# Patient Record
Sex: Male | Born: 1990 | Race: Black or African American | Hispanic: No | Marital: Single | Smoking: Never smoker
Health system: Southern US, Community
[De-identification: ages and names within clinical notes are randomized; demographics above are authoritative.]

## PROBLEM LIST (undated history)

## (undated) DIAGNOSIS — F329 Major depressive disorder, single episode, unspecified: Secondary | ICD-10-CM

## (undated) DIAGNOSIS — E669 Obesity, unspecified: Secondary | ICD-10-CM

## (undated) DIAGNOSIS — I639 Cerebral infarction, unspecified: Secondary | ICD-10-CM

## (undated) DIAGNOSIS — I5021 Acute systolic (congestive) heart failure: Secondary | ICD-10-CM

## (undated) DIAGNOSIS — F32A Depression, unspecified: Secondary | ICD-10-CM

## (undated) HISTORY — PX: OTHER SURGICAL HISTORY: SHX169

---

## 2016-09-18 ENCOUNTER — Emergency Department (HOSPITAL_COMMUNITY): Payer: 59

## 2016-09-18 ENCOUNTER — Observation Stay (HOSPITAL_COMMUNITY)
Admission: EM | Admit: 2016-09-18 | Discharge: 2016-09-21 | Disposition: A | Discharge status: Home / Self Care | Payer: 59 | Attending: Family Medicine | Admitting: Family Medicine

## 2016-09-18 ENCOUNTER — Encounter (HOSPITAL_COMMUNITY): Payer: Self-pay | Admitting: Emergency Medicine

## 2016-09-18 DIAGNOSIS — R531 Weakness: Secondary | ICD-10-CM | POA: Diagnosis not present

## 2016-09-18 DIAGNOSIS — Z903 Acquired absence of stomach [part of]: Secondary | ICD-10-CM | POA: Insufficient documentation

## 2016-09-18 DIAGNOSIS — R931 Abnormal findings on diagnostic imaging of heart and coronary circulation: Secondary | ICD-10-CM

## 2016-09-18 DIAGNOSIS — F329 Major depressive disorder, single episode, unspecified: Secondary | ICD-10-CM | POA: Diagnosis not present

## 2016-09-18 DIAGNOSIS — I5042 Chronic combined systolic (congestive) and diastolic (congestive) heart failure: Secondary | ICD-10-CM | POA: Diagnosis not present

## 2016-09-18 DIAGNOSIS — I429 Cardiomyopathy, unspecified: Secondary | ICD-10-CM | POA: Diagnosis not present

## 2016-09-18 DIAGNOSIS — Z9884 Bariatric surgery status: Secondary | ICD-10-CM | POA: Insufficient documentation

## 2016-09-18 DIAGNOSIS — N289 Disorder of kidney and ureter, unspecified: Secondary | ICD-10-CM

## 2016-09-18 DIAGNOSIS — G9389 Other specified disorders of brain: Secondary | ICD-10-CM | POA: Insufficient documentation

## 2016-09-18 DIAGNOSIS — I428 Other cardiomyopathies: Secondary | ICD-10-CM

## 2016-09-18 DIAGNOSIS — I110 Hypertensive heart disease with heart failure: Secondary | ICD-10-CM | POA: Insufficient documentation

## 2016-09-18 DIAGNOSIS — R471 Dysarthria and anarthria: Secondary | ICD-10-CM | POA: Diagnosis not present

## 2016-09-18 DIAGNOSIS — I10 Essential (primary) hypertension: Secondary | ICD-10-CM | POA: Diagnosis not present

## 2016-09-18 DIAGNOSIS — R200 Anesthesia of skin: Secondary | ICD-10-CM | POA: Diagnosis not present

## 2016-09-18 DIAGNOSIS — R2990 Unspecified symptoms and signs involving the nervous system: Secondary | ICD-10-CM | POA: Diagnosis not present

## 2016-09-18 DIAGNOSIS — R001 Bradycardia, unspecified: Secondary | ICD-10-CM | POA: Diagnosis not present

## 2016-09-18 DIAGNOSIS — Z7982 Long term (current) use of aspirin: Secondary | ICD-10-CM | POA: Insufficient documentation

## 2016-09-18 DIAGNOSIS — R079 Chest pain, unspecified: Secondary | ICD-10-CM | POA: Insufficient documentation

## 2016-09-18 DIAGNOSIS — Z8673 Personal history of transient ischemic attack (TIA), and cerebral infarction without residual deficits: Secondary | ICD-10-CM | POA: Diagnosis not present

## 2016-09-18 DIAGNOSIS — I639 Cerebral infarction, unspecified: Secondary | ICD-10-CM

## 2016-09-18 HISTORY — DX: Cerebral infarction, unspecified: I63.9

## 2016-09-18 HISTORY — DX: Depression, unspecified: F32.A

## 2016-09-18 HISTORY — DX: Major depressive disorder, single episode, unspecified: F32.9

## 2016-09-18 HISTORY — DX: Acute systolic (congestive) heart failure: I50.21

## 2016-09-18 HISTORY — DX: Obesity, unspecified: E66.9

## 2016-09-18 LAB — URINALYSIS, ROUTINE W REFLEX MICROSCOPIC
BILIRUBIN URINE: NEGATIVE
GLUCOSE, UA: NEGATIVE mg/dL
HGB URINE DIPSTICK: NEGATIVE
KETONES UR: NEGATIVE mg/dL
Leukocytes, UA: NEGATIVE
NITRITE: NEGATIVE
PH: 6 (ref 5.0–8.0)
Protein, ur: 30 mg/dL — AB
Specific Gravity, Urine: 1.037 — ABNORMAL HIGH (ref 1.005–1.030)

## 2016-09-18 LAB — I-STAT CHEM 8, ED
BUN: 10 mg/dL (ref 6–20)
CHLORIDE: 103 mmol/L (ref 101–111)
Calcium, Ion: 1.12 mmol/L — ABNORMAL LOW (ref 1.15–1.40)
Creatinine, Ser: 1.3 mg/dL — ABNORMAL HIGH (ref 0.61–1.24)
Glucose, Bld: 81 mg/dL (ref 65–99)
HEMATOCRIT: 45 % (ref 39.0–52.0)
Hemoglobin: 15.3 g/dL (ref 13.0–17.0)
Potassium: 3.4 mmol/L — ABNORMAL LOW (ref 3.5–5.1)
SODIUM: 143 mmol/L (ref 135–145)
TCO2: 27 mmol/L (ref 0–100)

## 2016-09-18 LAB — PROTIME-INR
INR: 1.14
PROTHROMBIN TIME: 14.6 s (ref 11.4–15.2)

## 2016-09-18 LAB — DIFFERENTIAL
Basophils Absolute: 0 10*3/uL (ref 0.0–0.1)
Basophils Relative: 0 %
Eosinophils Absolute: 0.1 10*3/uL (ref 0.0–0.7)
Eosinophils Relative: 1 %
LYMPHS PCT: 49 %
Lymphs Abs: 3.5 10*3/uL (ref 0.7–4.0)
Monocytes Absolute: 0.7 10*3/uL (ref 0.1–1.0)
Monocytes Relative: 10 %
NEUTROS ABS: 2.9 10*3/uL (ref 1.7–7.7)
NEUTROS PCT: 40 %

## 2016-09-18 LAB — COMPREHENSIVE METABOLIC PANEL
ALBUMIN: 4.2 g/dL (ref 3.5–5.0)
ALK PHOS: 36 U/L — AB (ref 38–126)
ALT: 25 U/L (ref 17–63)
ANION GAP: 8 (ref 5–15)
AST: 23 U/L (ref 15–41)
BUN: 9 mg/dL (ref 6–20)
CALCIUM: 9.7 mg/dL (ref 8.9–10.3)
CO2: 26 mmol/L (ref 22–32)
CREATININE: 1.34 mg/dL — AB (ref 0.61–1.24)
Chloride: 107 mmol/L (ref 101–111)
GFR calc Af Amer: >60 mL/min (ref >60)
GFR calc non Af Amer: >60 mL/min (ref >60)
GLUCOSE: 85 mg/dL (ref 65–99)
Potassium: 3.5 mmol/L (ref 3.5–5.1)
SODIUM: 141 mmol/L (ref 135–145)
Total Bilirubin: 1 mg/dL (ref 0.3–1.2)
Total Protein: 7.2 g/dL (ref 6.5–8.1)

## 2016-09-18 LAB — URINE MICROSCOPIC-ADD ON

## 2016-09-18 LAB — I-STAT TROPONIN, ED: Troponin i, poc: 0.01 ng/mL (ref 0.00–0.08)

## 2016-09-18 LAB — RAPID URINE DRUG SCREEN, HOSP PERFORMED
AMPHETAMINES: NOT DETECTED
Barbiturates: NOT DETECTED
Benzodiazepines: NOT DETECTED
Cocaine: NOT DETECTED
OPIATES: NOT DETECTED
TETRAHYDROCANNABINOL: NOT DETECTED

## 2016-09-18 LAB — TROPONIN I: Troponin I: <0.03 ng/mL (ref <0.03)

## 2016-09-18 LAB — CBC
HCT: 42.1 % (ref 39.0–52.0)
Hemoglobin: 14.7 g/dL (ref 13.0–17.0)
MCH: 31.7 pg (ref 26.0–34.0)
MCHC: 34.9 g/dL (ref 30.0–36.0)
MCV: 90.9 fL (ref 78.0–100.0)
PLATELETS: 168 10*3/uL (ref 150–400)
RBC: 4.63 MIL/uL (ref 4.22–5.81)
RDW: 12.9 % (ref 11.5–15.5)
WBC: 7.2 10*3/uL (ref 4.0–10.5)

## 2016-09-18 LAB — CBG MONITORING, ED: Glucose-Capillary: 84 mg/dL (ref 65–99)

## 2016-09-18 LAB — ANTITHROMBIN III: ANTITHROMB III FUNC: 118 % (ref 75–120)

## 2016-09-18 LAB — APTT: aPTT: 27 seconds (ref 24–36)

## 2016-09-18 LAB — ETHANOL: Alcohol, Ethyl (B): <5 mg/dL (ref <5)

## 2016-09-18 MED ORDER — ASPIRIN 300 MG RE SUPP: 300.0000 mg | Freq: Every day | RECTAL | Status: DC

## 2016-09-18 MED ORDER — HEPARIN SODIUM (PORCINE) 5000 UNIT/ML IJ SOLN: 5000.0000 [IU] | Freq: Three times a day (TID) | INTRAMUSCULAR | Status: DC

## 2016-09-18 MED ORDER — ASPIRIN 325 MG PO TABS
325.0000 mg | ORAL_TABLET | Freq: Every day | ORAL | Status: DC
  Filled 2016-09-18: qty 1

## 2016-09-18 MED ORDER — SODIUM CHLORIDE 0.9% FLUSH
3.0000 mL | Freq: Two times a day (BID) | INTRAVENOUS | Status: DC
  Administered 2016-09-19 – 2016-09-21 (×4): 3 mL via INTRAVENOUS

## 2016-09-18 MED ORDER — SODIUM CHLORIDE 0.9 % IV SOLN: 250.0000 mL | INTRAVENOUS | Status: DC | PRN

## 2016-09-18 MED ORDER — SODIUM CHLORIDE 0.9 % IV SOLN
INTRAVENOUS | Status: DC
  Administered 2016-09-18 – 2016-09-20 (×6): via INTRAVENOUS

## 2016-09-18 MED ORDER — IOPAMIDOL (ISOVUE-370) INJECTION 76%
INTRAVENOUS | Status: AC
  Filled 2016-09-18: qty 50

## 2016-09-18 MED ORDER — ASPIRIN 325 MG PO TABS
325.0000 mg | ORAL_TABLET | Freq: Every day | ORAL | Status: DC
  Administered 2016-09-19 – 2016-09-21 (×3): 325 mg via ORAL
  Filled 2016-09-18 (×3): qty 1

## 2016-09-18 MED ORDER — CLOPIDOGREL BISULFATE 75 MG PO TABS
75.0000 mg | ORAL_TABLET | Freq: Every day | ORAL | Status: DC
  Administered 2016-09-18 – 2016-09-21 (×4): 75 mg via ORAL
  Filled 2016-09-18 (×4): qty 1

## 2016-09-18 MED ORDER — FAMOTIDINE 20 MG PO TABS
40.0000 mg | ORAL_TABLET | Freq: Every day | ORAL | Status: DC
  Administered 2016-09-18 – 2016-09-21 (×4): 40 mg via ORAL
  Filled 2016-09-18 (×4): qty 2

## 2016-09-18 MED ORDER — IOPAMIDOL (ISOVUE-370) INJECTION 76%
50.0000 mL | Freq: Once | INTRAVENOUS | Status: AC | PRN
  Administered 2016-09-18: 50 mL via INTRAVENOUS

## 2016-09-18 MED ORDER — HYDRALAZINE HCL 20 MG/ML IJ SOLN: 5.0000 mg | INTRAMUSCULAR | Status: DC | PRN

## 2016-09-18 MED ORDER — ASPIRIN 325 MG PO TABS
325.0000 mg | ORAL_TABLET | Freq: Once | ORAL | Status: AC
  Administered 2016-09-18: 325 mg via ORAL

## 2016-09-18 MED ORDER — GI COCKTAIL ~~LOC~~
30.0000 mL | Freq: Four times a day (QID) | ORAL | Status: DC | PRN
  Administered 2016-09-20: 30 mL via ORAL
  Filled 2016-09-18: qty 30

## 2016-09-18 MED ORDER — HEPARIN SODIUM (PORCINE) 5000 UNIT/ML IJ SOLN
5000.0000 [IU] | Freq: Three times a day (TID) | INTRAMUSCULAR | Status: DC
  Administered 2016-09-18 – 2016-09-21 (×9): 5000 [IU] via SUBCUTANEOUS
  Filled 2016-09-18 (×9): qty 1

## 2016-09-18 MED ORDER — STROKE: EARLY STAGES OF RECOVERY BOOK
Freq: Once | Status: AC
  Administered 2016-09-18: 23:00:00
  Filled 2016-09-18: qty 1

## 2016-09-18 MED ORDER — ALTEPLASE (STROKE) FULL DOSE INFUSION
85.0000 mg | Freq: Once | INTRAVENOUS | Status: DC
  Filled 2016-09-18: qty 100

## 2016-09-18 MED ORDER — ALPRAZOLAM 0.25 MG PO TABS: 0.2500 mg | ORAL_TABLET | Freq: Two times a day (BID) | ORAL | Status: DC | PRN

## 2016-09-18 MED ORDER — SODIUM CHLORIDE 0.9% FLUSH: 3.0000 mL | INTRAVENOUS | Status: DC | PRN

## 2016-09-18 MED ORDER — ONDANSETRON HCL 4 MG/2ML IJ SOLN: 4.0000 mg | Freq: Four times a day (QID) | INTRAMUSCULAR | Status: DC | PRN

## 2016-09-18 MED ORDER — ACETAMINOPHEN 325 MG PO TABS: 650.0000 mg | ORAL_TABLET | ORAL | Status: DC | PRN

## 2016-09-18 NOTE — ED Triage Notes (Addendum)
LSN 0900. Pt was in class and teacher noticed pt acting strange. Pt's right arm flaccid, leg arm flaccid, slurred speech and right sided facial droop. BP systolic 160. Pt with hx of stroke in 2016. Upon arrival to ED,  pt unable to move right side/slurred speeched.

## 2016-09-18 NOTE — ED Notes (Signed)
Pt in MRI with RN and stroke team

## 2016-09-18 NOTE — ED Notes (Signed)
Gave pt [place] sandwich and water. Passed swallow screen

## 2016-09-18 NOTE — ED Notes (Signed)
This RN spoke with pt about his previous career and asked if pt is enjoying his school. Pt opened up to RN and shared that he is very sad here, does not have one friend and no support here- "very lonely". Pt upset that prior to coming to school, he had a great job, but came to school to "prove himself" to everyone back home. Pt states he feels like a failure if he does not complete schooling, but he is so unhappy here. Pt states his ex girlfriend will walk by him here at school and act like she does not see him. Pt on phone with mother telling her that he needs to be taken back home because of his "stroke". He continued to inform her that everyone in the class witnessed stroke and told him he needed to take a medical leave and go home. Per this RN's assessment, pt appears depressed with current situation at school and life, and is looking for an alternative way to get to go back home with his family. RN informed admitting MD of conversation with pt. RN comforted pt. During conversation, pt able to move right arm/leg. Pt's speech intermittently clear/slurred.

## 2016-09-18 NOTE — ED Provider Notes (Signed)
[facility]-EMERGENCY DEPT Provider Note   CSN: [id number] Arrival date & time: 09/18/16  1191     History   Chief Complaint Chief Complaint  Patient presents with  . Code Stroke    HPI Ryan Howard is a 25 y.o. male with a past medical history significant for prior stroke who presents as a code stroke for right-sided deficits. Patient in class and onset of symptoms was 9 AM. Patient has complete right-sided weakness and numbness. Dysarthria. History of prior stroke, gastric sleeve surgery, and prior anticoagulation that was stopped 1 year ago for surgery.  Patient is dysarthric and unable to answer questions on arrival to the ED. Patient quickly taken to CT scanner with neurology.     The history is provided by the EMS personnel. The history is limited by the condition of the patient. No language interpreter was used.  Cerebrovascular Accident  This is a new problem. The current episode started less than 1 hour ago. The problem occurs constantly. The problem has not changed since onset.Associated symptoms include chest pain (yesterday). Pertinent negatives include no abdominal pain, no headaches and no shortness of breath. Nothing aggravates the symptoms. Nothing relieves the symptoms. He has tried nothing for the symptoms. The treatment provided no relief.    Past Medical History:  Diagnosis Date  . Stroke [facility])     There are no active problems to display for this patient.   No past surgical history on file.     Home Medications    Prior to Admission medications   Not on File    Family History No family history on file.  Social History Social History  Substance Use Topics  . Smoking status: Not on file  . Smokeless tobacco: Not on file  . Alcohol use Not on file     Allergies   Review of patient's allergies indicates not on file.   Review of Systems Review of Systems  Reason unable to perform ROS: ROS obtained after workup on reassessment when pt able to  answer qwuestions.  Constitutional: Negative for chills, fatigue and fever.  HENT: Negative for congestion.   Eyes: Negative for visual disturbance.  Respiratory: Negative for cough, chest tightness, shortness of breath and stridor.   Cardiovascular: Positive for chest pain (yesterday).  Gastrointestinal: Negative for abdominal pain, diarrhea, nausea and vomiting.  Genitourinary: Negative for dysuria.  Musculoskeletal: Negative for back pain, neck pain and neck stiffness.  Neurological: Positive for facial asymmetry, speech difficulty, weakness and numbness. Negative for dizziness, light-headedness and headaches.  Psychiatric/Behavioral: Negative for agitation.  All other systems reviewed and are negative.    Physical Exam Updated Vital Signs Wt 208 lb 1.8 oz (94.4 kg)   Physical Exam  Constitutional: He is oriented to person, place, and time. He appears well-developed and well-nourished.  HENT:  Head: Atraumatic.  Mouth/Throat: Oropharynx is clear and moist. No oropharyngeal exudate.  Eyes: Conjunctivae and EOM are normal. Pupils are equal, round, and reactive to light.  Neck: Normal range of motion. Neck supple.  Cardiovascular: Normal rate and regular rhythm.   No murmur heard. Pulmonary/Chest: Effort normal and breath sounds normal. No stridor. No respiratory distress. He has no wheezes. He exhibits no tenderness.  Abdominal: Soft. There is no tenderness.  Musculoskeletal: He exhibits no edema or tenderness.  Neurological: He is alert and oriented to person, place, and time. A cranial nerve deficit and sensory deficit is present. He exhibits abnormal muscle tone. GCS eye subscore is 4. GCS verbal subscore is  5. GCS motor subscore is 6.  Complete right-sided weakness in her right face, right arm, and right leg. Right-sided numbness.  Skin: Skin is warm and dry.  Psychiatric: He has a normal mood and affect.  Nursing note and vitals reviewed.    ED Treatments / Results   Labs (all labs ordered are listed, but only abnormal results are displayed) Labs Reviewed  COMPREHENSIVE METABOLIC PANEL - Abnormal; Notable for the following:       Result Value   Creatinine, Ser 1.34 (*)    Alkaline Phosphatase 36 (*)    All other components within normal limits  I-STAT CHEM 8, ED - Abnormal; Notable for the following:    Potassium 3.4 (*)    Creatinine, Ser 1.30 (*)    Calcium, Ion 1.12 (*)    All other components within normal limits  ETHANOL  PROTIME-INR  APTT  CBC  DIFFERENTIAL  RAPID URINE DRUG SCREEN, HOSP PERFORMED  URINALYSIS, ROUTINE W REFLEX MICROSCOPIC (NOT AT [facility])  ANTITHROMBIN III  PROTEIN C ACTIVITY  PROTEIN C, TOTAL  PROTEIN S ACTIVITY  PROTEIN S, TOTAL  LUPUS ANTICOAGULANT PANEL  BETA-2-GLYCOPROTEIN I ABS, IGG/M/A  HOMOCYSTEINE  FACTOR 5 LEIDEN  PROTHROMBIN GENE MUTATION  CARDIOLIPIN ANTIBODIES, IGG, IGM, IGA  I-STAT TROPOININ, ED  CBG MONITORING, ED    EKG  EKG Interpretation  Date/Time:  Tuesday September 18 2016 11:06:02 EDT Ventricular Rate:  55 PR Interval:    QRS Duration: 100 QT Interval:  504 QTC Calculation: 483 R Axis:   74 Text Interpretation:  Sinus rhythm Short PR interval Prominent P waves, nondiagnostic Nonspecific T abnormalities, lateral leads Borderline prolonged QT interval No prior ECG. Artifact present.  No STEMI Abnormal ECG.  Confirmed by Rush Landmark MD, [place] Carn [phone]) on 09/18/2016 11:56:55 AM       Radiology Ct Angio Head W Or Wo Contrast  Result Date: 09/18/2016 CLINICAL DATA:  25 year old male code stroke. Right side symptoms and abnormal speech. Abnormal posterior left MCA cortex on noncontrast head CT, but appeared more subacute to chronic rather than acute. Initial encounter. EXAM: CT ANGIOGRAPHY HEAD AND NECK CT PERFUSION BRAIN TECHNIQUE: Multidetector CT perfusion omitted imaging of the brain during bolus IV contrast administration. Post processing using the RAPID software system. Multi detector  CT Imaging of the head and neck was performed using the standard protocol during bolus administration of intravenous contrast. Multiplanar CT image reconstructions and MIPs were obtained to evaluate the vascular anatomy. Carotid stenosis measurements (when applicable) are obtained utilizing NASCET criteria, using the distal internal carotid diameter as the denominator. CONTRAST:  50 mL Isovue 370 COMPARISON:  Noncontrast head CT 0935 hours today. FINDINGS: CT BRAIN PERFUSION FINDINGS RAPID post processing of CT perfusion reveals no evidence of cerebral core infarct (designated as CBF less than 30%) or penumbra (designated as Tmax greater than 6 seconds). Cerebral perfusion maps appear essentially symmetric and within normal limits. CTA NECK Skeleton: No acute osseous abnormality identified. Visualized paranasal sinuses and mastoids are stable and well pneumatized. Upper chest: Negative lung apices. No superior mediastinal lymphadenopathy. Other neck: Negative thyroid, larynx, pharynx, parapharyngeal spaces, retropharyngeal space, sublingual space, submandibular glands and parotid glands. No cervical lymphadenopathy. Aortic arch: 3 vessel arch configuration with no arch or proximal great vessel atherosclerosis or stenosis. Right carotid system: Negative. Left carotid system: Negative. Vertebral arteries:No proximal subclavian artery stenosis. Dominant right vertebral artery. Normal right vertebral artery origin. Normal cervical right vertebral artery. Non dominant left vertebral artery has a normal origin. The left  vertebral is somewhat diminutive, but patent and otherwise normal throughout the neck. CTA HEAD Posterior circulation: Dominant distal right vertebral artery is normal. Right PICA origin is normal. Left PICA origin is normal. The left V4 segment is highly diminutive but patent to the vertebrobasilar junction. Normal basilar artery. Normal SCA and right PCA origins. Fetal type left PCA origin. Right  posterior communicating artery also is present. PCA branches are within normal limits. Anterior circulation: Both ICA siphons are patent and normal. Normal ophthalmic and posterior communicating artery origins. Normal carotid termini. Normal MCA and ACA origins. Anterior communicating artery and bilateral ACA branches are normal. Right MCA M1 segment, bifurcation, and right MCA branches are normal. Left MCA M1 segment and bifurcation are normal. Proximal left MCA branches are normal. Questionable mild posterior left in 3 branch irregularity. No left MCA branch occlusion identified. Venous sinuses: Patent. Anatomic variants: Dominant right vertebral artery. Fetal type left PCA origin. Review of the MIP images confirms the above findings IMPRESSION: 1. Negative for emergent large vessel occlusion. No left MCA branch occlusion identified. 2. Negative CT Perfusion findings. 3. Questionable mild irregularity of distal posterior left MCA branches. Otherwise normal arterial findings on CTA head and neck. 4. Consider PFO or other source of paradoxical emboli in this clinical setting. 5. Preliminary findings of the above discussed by telephone with Dr. Lucia Gaskins on 09/18/2016 at 1011 hours. She wants to pursue limited diffusion MR imaging of the brain, which is pending at this time. Electronically Signed   By: Odessa Fleming M.D.   On: 09/18/2016 10:30   Ct Angio Neck W Or Wo Contrast  Result Date: 09/18/2016 CLINICAL DATA:  25 year old male code stroke. Right side symptoms and abnormal speech. Abnormal posterior left MCA cortex on noncontrast head CT, but appeared more subacute to chronic rather than acute. Initial encounter. EXAM: CT ANGIOGRAPHY HEAD AND NECK CT PERFUSION BRAIN TECHNIQUE: Multidetector CT perfusion omitted imaging of the brain during bolus IV contrast administration. Post processing using the RAPID software system. Multi detector CT Imaging of the head and neck was performed using the standard protocol during  bolus administration of intravenous contrast. Multiplanar CT image reconstructions and MIPs were obtained to evaluate the vascular anatomy. Carotid stenosis measurements (when applicable) are obtained utilizing NASCET criteria, using the distal internal carotid diameter as the denominator. CONTRAST:  50 mL Isovue 370 COMPARISON:  Noncontrast head CT 0935 hours today. FINDINGS: CT BRAIN PERFUSION FINDINGS RAPID post processing of CT perfusion reveals no evidence of cerebral core infarct (designated as CBF less than 30%) or penumbra (designated as Tmax greater than 6 seconds). Cerebral perfusion maps appear essentially symmetric and within normal limits. CTA NECK Skeleton: No acute osseous abnormality identified. Visualized paranasal sinuses and mastoids are stable and well pneumatized. Upper chest: Negative lung apices. No superior mediastinal lymphadenopathy. Other neck: Negative thyroid, larynx, pharynx, parapharyngeal spaces, retropharyngeal space, sublingual space, submandibular glands and parotid glands. No cervical lymphadenopathy. Aortic arch: 3 vessel arch configuration with no arch or proximal great vessel atherosclerosis or stenosis. Right carotid system: Negative. Left carotid system: Negative. Vertebral arteries:No proximal subclavian artery stenosis. Dominant right vertebral artery. Normal right vertebral artery origin. Normal cervical right vertebral artery. Non dominant left vertebral artery has a normal origin. The left vertebral is somewhat diminutive, but patent and otherwise normal throughout the neck. CTA HEAD Posterior circulation: Dominant distal right vertebral artery is normal. Right PICA origin is normal. Left PICA origin is normal. The left V4 segment is highly diminutive but patent to  the vertebrobasilar junction. Normal basilar artery. Normal SCA and right PCA origins. Fetal type left PCA origin. Right posterior communicating artery also is present. PCA branches are within normal limits.  Anterior circulation: Both ICA siphons are patent and normal. Normal ophthalmic and posterior communicating artery origins. Normal carotid termini. Normal MCA and ACA origins. Anterior communicating artery and bilateral ACA branches are normal. Right MCA M1 segment, bifurcation, and right MCA branches are normal. Left MCA M1 segment and bifurcation are normal. Proximal left MCA branches are normal. Questionable mild posterior left in 3 branch irregularity. No left MCA branch occlusion identified. Venous sinuses: Patent. Anatomic variants: Dominant right vertebral artery. Fetal type left PCA origin. Review of the MIP images confirms the above findings IMPRESSION: 1. Negative for emergent large vessel occlusion. No left MCA branch occlusion identified. 2. Negative CT Perfusion findings. 3. Questionable mild irregularity of distal posterior left MCA branches. Otherwise normal arterial findings on CTA head and neck. 4. Consider PFO or other source of paradoxical emboli in this clinical setting. 5. Preliminary findings of the above discussed by telephone with Dr. Lucia GaskinsAhern on 09/18/2016 at 1011 hours. She wants to pursue limited diffusion MR imaging of the brain, which is pending at this time. Electronically Signed   By: Odessa FlemingH  Hall M.D.   On: 09/18/2016 10:30   Mr Brain Wo Contrast  Result Date: 09/18/2016 CLINICAL DATA:  25 year old male code stroke presentation today with right side symptoms. Subacute to chronic appearing posterior left MCA ischemia on noncontrast CT, with largely negative CTA/CTP findings. Initial encounter. EXAM: MRI HEAD WITHOUT CONTRAST TECHNIQUE: Multiplanar, multiecho pulse sequences of the brain and surrounding structures were obtained without intravenous contrast. COMPARISON:  CTA, CT perfusion and CT head from today. FINDINGS: Brain: No restricted diffusion or evidence of acute infarction. Mild encephalomalacia with Facilitated cortical diffusion along the posterior left sylvian fissure  corresponding to the cortical abnormality on CT today. Elsewhere normal gray and white matter signal. No chronic cerebral blood products identified. No midline shift, mass effect, evidence of mass lesion, ventriculomegaly, extra-axial collection or acute intracranial hemorrhage. Cervicomedullary junction and pituitary are within normal limits. Vascular: Major intracranial vascular flow voids are preserved. Skull and upper cervical spine: Negative. Normal bone marrow signal. Sinuses/Orbits: Negative orbits. Visualized paranasal sinuses and mastoids are stable and well pneumatized. Other: Negative scalp soft tissues. Visible internal auditory structures appear normal. IMPRESSION: 1. Negative for acute infarct or acute intracranial abnormality. 2. Small area of chronic ischemia in the posterior left MCA territory, corresponding to the CT findings today. 3. Preliminary report of the above discussed by telephone with Dr. Lucia GaskinsAhern on 09/18/2016 At 1045 hours. Electronically Signed   By: Odessa FlemingH  Hall M.D.   On: 09/18/2016 11:07   Ct Cerebral Perfusion W Contrast  Result Date: 09/18/2016 CLINICAL DATA:  25 year old male code stroke. Right side symptoms and abnormal speech. Abnormal posterior left MCA cortex on noncontrast head CT, but appeared more subacute to chronic rather than acute. Initial encounter. EXAM: CT ANGIOGRAPHY HEAD AND NECK CT PERFUSION BRAIN TECHNIQUE: Multidetector CT perfusion omitted imaging of the brain during bolus IV contrast administration. Post processing using the RAPID software system. Multi detector CT Imaging of the head and neck was performed using the standard protocol during bolus administration of intravenous contrast. Multiplanar CT image reconstructions and MIPs were obtained to evaluate the vascular anatomy. Carotid stenosis measurements (when applicable) are obtained utilizing NASCET criteria, using the distal internal carotid diameter as the denominator. CONTRAST:  50 mL Isovue 370  COMPARISON:  Noncontrast head CT 0935 hours today. FINDINGS: CT BRAIN PERFUSION FINDINGS RAPID post processing of CT perfusion reveals no evidence of cerebral core infarct (designated as CBF less than 30%) or penumbra (designated as Tmax greater than 6 seconds). Cerebral perfusion maps appear essentially symmetric and within normal limits. CTA NECK Skeleton: No acute osseous abnormality identified. Visualized paranasal sinuses and mastoids are stable and well pneumatized. Upper chest: Negative lung apices. No superior mediastinal lymphadenopathy. Other neck: Negative thyroid, larynx, pharynx, parapharyngeal spaces, retropharyngeal space, sublingual space, submandibular glands and parotid glands. No cervical lymphadenopathy. Aortic arch: 3 vessel arch configuration with no arch or proximal great vessel atherosclerosis or stenosis. Right carotid system: Negative. Left carotid system: Negative. Vertebral arteries:No proximal subclavian artery stenosis. Dominant right vertebral artery. Normal right vertebral artery origin. Normal cervical right vertebral artery. Non dominant left vertebral artery has a normal origin. The left vertebral is somewhat diminutive, but patent and otherwise normal throughout the neck. CTA HEAD Posterior circulation: Dominant distal right vertebral artery is normal. Right PICA origin is normal. Left PICA origin is normal. The left V4 segment is highly diminutive but patent to the vertebrobasilar junction. Normal basilar artery. Normal SCA and right PCA origins. Fetal type left PCA origin. Right posterior communicating artery also is present. PCA branches are within normal limits. Anterior circulation: Both ICA siphons are patent and normal. Normal ophthalmic and posterior communicating artery origins. Normal carotid termini. Normal MCA and ACA origins. Anterior communicating artery and bilateral ACA branches are normal. Right MCA M1 segment, bifurcation, and right MCA branches are normal. Left  MCA M1 segment and bifurcation are normal. Proximal left MCA branches are normal. Questionable mild posterior left in 3 branch irregularity. No left MCA branch occlusion identified. Venous sinuses: Patent. Anatomic variants: Dominant right vertebral artery. Fetal type left PCA origin. Review of the MIP images confirms the above findings IMPRESSION: 1. Negative for emergent large vessel occlusion. No left MCA branch occlusion identified. 2. Negative CT Perfusion findings. 3. Questionable mild irregularity of distal posterior left MCA branches. Otherwise normal arterial findings on CTA head and neck. 4. Consider PFO or other source of paradoxical emboli in this clinical setting. 5. Preliminary findings of the above discussed by telephone with Dr. Lucia Gaskins on 09/18/2016 at 1011 hours. She wants to pursue limited diffusion MR imaging of the brain, which is pending at this time. Electronically Signed   By: Odessa Fleming M.D.   On: 09/18/2016 10:30   Ct Head Code Stroke W/o Cm  Result Date: 09/18/2016 CLINICAL DATA:  Code stroke. 25 year old male with right side weakness and difficulty speaking. Initial encounter. EXAM: CT HEAD WITHOUT CONTRAST TECHNIQUE: Contiguous axial images were obtained from the base of the skull through the vertex without intravenous contrast. COMPARISON:  None. FINDINGS: Brain: Positive for abnormal cortex and subcortical white matter along the posterior left sylvian fissure and the superior lateral left peri-Rolandic cortex. See series 201, images 16- 22. However, at least some of this appears subacute to chronic. No associated mass effect. Gray-white matter differentiation elsewhere is within normal limits. No acute intracranial hemorrhage identified. No ventriculomegaly. Vascular: Anterior circulation vasculature appears symmetric without suspicious intracranial vascular hyperdensity. Skull: Visualized osseous structures are within normal limits. Sinuses/Orbits: Clear. Other: Negative orbit and  scalp soft tissues. ASPECTS [facility] Stroke Program Early CT Score) The following is assuming all of the posterior left MCA territory abnormal cortex is acute rather than subacute to chronic. - Ganglionic level infarction (caudate, lentiform nuclei, internal capsule, insula, M1-M3  cortex): 7 - Supraganglionic infarction (M4-M6 cortex): 1 Total score (0-10 with 10 being normal): 8 IMPRESSION: 1. Evidence of cortically based infarct in the posterior left MCA territory, but appears more subacute to chronic than acute. No associated hemorrhage or mass effect. No other changes of acute cortically based infarct. 2. ASPECTS is 8, BUT this is assuming that all of the findings in #1 are acute. 3. Study discussed by telephone with Dr. Lucia Gaskins on 09/18/2016 at 09:51 . Electronically Signed   By: Odessa Fleming M.D.   On: 09/18/2016 09:52    Procedures Procedures (including critical care time)  Medications Ordered in ED Medications  iopamidol (ISOVUE-370) 76 % injection (not administered)  iopamidol (ISOVUE-370) 76 % injection 50 mL (50 mLs Intravenous Contrast Given 09/18/16 0948)     Initial Impression / Assessment and Plan / ED Course  I have reviewed the triage vital signs and the nursing notes.  Pertinent labs & imaging results that were available during my care of the patient were reviewed by me and considered in my medical decision making (see chart for details).  Clinical Course    Kemonte Edgin is a 25 y.o. male with a past medical history significant for prior stroke who presents as a code stroke for right-sided deficits.  Patient was made code stroke prior to arrival. Neurology was at the bedside on arrival. On my exam, patient has complete right-sided weakness and right face, right arm, and right leg. Patient was unable to move anything on his right side. Extraocular motions were intact however. Patient had no sensation on the right and did not flinch when right-sided IV was started in his arm.  Patient dysarthric and had a right-sided facial droop.  Patient quickly taken to scanner and had stroke workup.  CT scan showed evidence of his old stroke but no new abnormality. No evidence of large vessel occlusion seen on perfusion study. There was questionable mild irregularity on distal posterior left MCA branches.  MRI was then performed and showed no acute infarct or acute intracranial abnormality. There was an area of chronic ischemia on the posterior left MCA territory.  According to neurology, patient had improvement in his exam after imaging studies. Neurology also received prior records showing concern for CHF.   Neurology recommended admission to the hospitalist service for further workup of likely TIA. Patient will also require further workup of his prior chest pain.    Final Clinical Impressions(s) / ED Diagnoses   Final diagnoses:  Right sided weakness  Numbness  Dysarthria    Clinical Impression: 1. Numbness   2. Right sided weakness   3. CVA (cerebral vascular accident) (HCC)   4. Dysarthria     Disposition: Admit to Hospitalist service with neurology following.    Heide Scales, MD 09/18/16 1900

## 2016-09-18 NOTE — Code Documentation (Addendum)
Patient arrives via [facility] with a sudden onset of right sided hemiparesis and slurred speech. The patient was sitting in class this morning when his teacher noticed at 0900 the patient was not able to speak clearly. EMS was thus called and subsequently a Code Stroke was activated. EMS stated the pt has a hx of a stroke in 2016. Patient was able to convey that he was on a blood thinner but was unable to provide a date for his last dose. The patient was also able to convey that he has had a gastric surgery in the past but unable to tell us what and when. Upon arrival to [facility] the pt scored an 8 on the NIHSS. NIHSS was remarkable for right facial palsy, RLE had no effort against gravity, right sided sensory loss, moderate dysarthria, and slight inattention on the right side. CT of the head was done. After the CT of the head, the patient's RUE no longer had any issues and the patient was able to hold it up. RLE was still unable to move. A CTA of the head and neck along with a CTP were completed. After the CT scans, the patient's RLE was able to move but was still weak. The patient's speech also became more comprehendible. Neurologist MD stated all scans were negative. With an unsure hx and symptoms improving, decision not treat with tPA was made. MRI/DWI imaging negative for acute infarct per Dr. Lucia Gaskins. Bedside handoff with [facility].

## 2016-09-18 NOTE — H&P (Addendum)
History and Physical    Ryan Howard ZOX:[id number] DOB: 03/27/1991 DOA: 09/18/2016  PCP: No primary care provider on file. Patient coming from: school  Chief Complaint: CP, R side numbness/weakness  HPI: Ryan Howard is a 25 y.o. male with medical history significant of stroke, morbid obesity status post gastric sleeve procedure, presenting with two-day history of chest pain and one-day history of R-sided numbness and weakness. Patient describes onset of chest pain gradually last night while resting just prior to bed. Associated with a "noticeable heartbeat. " As any diaphoresis, nausea, shortness of breath or radiation of pain to shoulder or neck. Patient states he is able to go to bed despite the pain. Woke up this morning initially with minimal pain which went away spontaneously when patient developed R-sided numbness and weakness. Symptoms of numbness and weakness began acutely while sitting in his class performing schoolwork. Involvement includes the RLE, RUE and R face in the V2 and V3 distributions. Slowly improving. Patient is not on any antiplatelet therapy due to history of gastric bypass and sleeve. Patient states that he has had residual right hand numbness since previous stroke 1 year ago but no other permanent deficits.    ED Course: The findings outlined below. Code stroke team called for initial evaluation and following.  Review of Systems: As per HPI otherwise 10 point review of systems negative.   Ambulatory Status: No restrictions  Past Medical History:  Diagnosis Date  . CVA (cerebral vascular accident) (HCC)   . Obesity   . 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 Stroke [facility](HCC)     Past Surgical History:  Procedure Laterality Date  . Laparoscopic sleeve gastrectomy      Social History   Social History  . Marital status: Single    Spouse name: N/A  . Number of children: N/A  . Years of education: N/A    Occupational History  . Not on file.   Social History Main Topics  . Smoking status: Never Smoker  . Smokeless tobacco: Never Used  . Alcohol use No  . Drug use: No  . Sexual activity: Not on file   Other Topics Concern  . Not on file   Social History Narrative  . No narrative on file    No Known Allergies  Family History  Problem Relation Age of Onset  . Stroke Paternal Grandfather   . Diabetes Other   . Heart disease Other   . Stroke Other     Prior to Admission medications   Not on File    Physical Exam: Vitals:   09/18/16 1000 09/18/16 1100 09/18/16 1104 09/18/16 1115  BP: (!) 162/110 161/100 161/100 (!) 169/107  Pulse:  68  61  Resp:   15 22  Temp:   97.8 F (36.6 C)   TempSrc:   Oral   SpO2:  100% 100% 100%  Weight:   94.4 kg (208 lb 1.8 oz)   Height:   6\' 2"  (1.88 m)      General:  Appears calm and comfortable Eyes:  PERRL, EOMI, normal lids, iris ENT:  grossly normal hearing, lips & tongue, mmm Neck:  no LAD, masses or thyromegaly Cardiovascular:  RRR, no m/r/g. No LE edema.  Respiratory:  CTA bilaterally, no w/r/r. Normal respiratory effort. Abdomen:  soft, ntnd, NABS Skin:  no rash or induration seen on limited exam Musculoskletal:  grossly normal tone BUE/BLE, good ROM, no bony abnormality Psychiatric:  grossly  normal mood and affect, speech fluent and appropriate, AOx3 Neurologic: Right facial numbness and V2 V3 degrees distributions, right facial droop, right upper extremity and right lower extremity with 3-5 grip strength and hip flexion with contralateral 5 out of 5 strength, and limited sensation in the right upper extremity and right lower extremity  Labs on Admission: I have personally reviewed following labs and imaging studies  CBC:  Recent Labs Lab 09/18/16 0925 09/18/16 0944  WBC 7.2  --   NEUTROABS 2.9  --   HGB 14.7 15.3  HCT 42.1 45.0  MCV 90.9  --   PLT 168  --    Basic Metabolic Panel:  Recent Labs Lab  09/18/16 0925 09/18/16 0944  NA 141 143  K 3.5 3.4*  CL 107 103  CO2 26  --   GLUCOSE 85 81  BUN 9 10  CREATININE 1.34* 1.30*  CALCIUM 9.7  --    GFR: Estimated Creatinine Clearance: 101 mL/min (by C-G formula based on SCr of 1.3 mg/dL (H)). Liver Function Tests:  Recent Labs Lab 09/18/16 0925  AST 23  ALT 25  ALKPHOS 36*  BILITOT 1.0  PROT 7.2  ALBUMIN 4.2   No results for input(s): LIPASE, AMYLASE in the last 168 hours. No results for input(s): AMMONIA in the last 168 hours. Coagulation Profile:  Recent Labs Lab 09/18/16 0925  INR 1.14   Cardiac Enzymes: No results for input(s): CKTOTAL, CKMB, CKMBINDEX, TROPONINI in the last 168 hours. BNP (last 3 results) No results for input(s): PROBNP in the last 8760 hours. HbA1C: No results for input(s): HGBA1C in the last 72 hours. CBG:  Recent Labs Lab 09/18/16 0937  GLUCAP 84   Lipid Profile: No results for input(s): CHOL, HDL, LDLCALC, TRIG, CHOLHDL, LDLDIRECT in the last 72 hours. Thyroid Function Tests: No results for input(s): TSH, T4TOTAL, FREET4, T3FREE, THYROIDAB in the last 72 hours. Anemia Panel: No results for input(s): VITAMINB12, FOLATE, FERRITIN, TIBC, IRON, RETICCTPCT in the last 72 hours. Urine analysis: No results found for: COLORURINE, APPEARANCEUR, LABSPEC, PHURINE, GLUCOSEU, HGBUR, BILIRUBINUR, KETONESUR, PROTEINUR, UROBILINOGEN, NITRITE, LEUKOCYTESUR  Creatinine Clearance: Estimated Creatinine Clearance: 101 mL/min (by C-G formula based on SCr of 1.3 mg/dL (H)).  Sepsis Labs: @LABRCNTIP (procalcitonin:4,lacticidven:4) )No results found for this or any previous visit (from the past 240 hour(s)).   Radiological Exams on Admission: Ct Angio Head W Or Wo Contrast  Result Date: 09/18/2016 CLINICAL DATA:  25 year old male code stroke. Right side symptoms and abnormal speech. Abnormal posterior left MCA cortex on noncontrast head CT, but appeared more subacute to chronic rather than acute.  Initial encounter. EXAM: CT ANGIOGRAPHY HEAD AND NECK CT PERFUSION BRAIN TECHNIQUE: Multidetector CT perfusion omitted imaging of the brain during bolus IV contrast administration. Post processing using the RAPID software system. Multi detector CT Imaging of the head and neck was performed using the standard protocol during bolus administration of intravenous contrast. Multiplanar CT image reconstructions and MIPs were obtained to evaluate the vascular anatomy. Carotid stenosis measurements (when applicable) are obtained utilizing NASCET criteria, using the distal internal carotid diameter as the denominator. CONTRAST:  50 mL Isovue 370 COMPARISON:  Noncontrast head CT 0935 hours today. FINDINGS: CT BRAIN PERFUSION FINDINGS RAPID post processing of CT perfusion reveals no evidence of cerebral core infarct (designated as CBF less than 30%) or penumbra (designated as Tmax greater than 6 seconds). Cerebral perfusion maps appear essentially symmetric and within normal limits. CTA NECK Skeleton: No acute osseous abnormality identified. Visualized paranasal sinuses and  mastoids are stable and well pneumatized. Upper chest: Negative lung apices. No superior mediastinal lymphadenopathy. Other neck: Negative thyroid, larynx, pharynx, parapharyngeal spaces, retropharyngeal space, sublingual space, submandibular glands and parotid glands. No cervical lymphadenopathy. Aortic arch: 3 vessel arch configuration with no arch or proximal great vessel atherosclerosis or stenosis. Right carotid system: Negative. Left carotid system: Negative. Vertebral arteries:No proximal subclavian artery stenosis. Dominant right vertebral artery. Normal right vertebral artery origin. Normal cervical right vertebral artery. Non dominant left vertebral artery has a normal origin. The left vertebral is somewhat diminutive, but patent and otherwise normal throughout the neck. CTA HEAD Posterior circulation: Dominant distal right vertebral artery is  normal. Right PICA origin is normal. Left PICA origin is normal. The left V4 segment is highly diminutive but patent to the vertebrobasilar junction. Normal basilar artery. Normal SCA and right PCA origins. Fetal type left PCA origin. Right posterior communicating artery also is present. PCA branches are within normal limits. Anterior circulation: Both ICA siphons are patent and normal. Normal ophthalmic and posterior communicating artery origins. Normal carotid termini. Normal MCA and ACA origins. Anterior communicating artery and bilateral ACA branches are normal. Right MCA M1 segment, bifurcation, and right MCA branches are normal. Left MCA M1 segment and bifurcation are normal. Proximal left MCA branches are normal. Questionable mild posterior left in 3 branch irregularity. No left MCA branch occlusion identified. Venous sinuses: Patent. Anatomic variants: Dominant right vertebral artery. Fetal type left PCA origin. Review of the MIP images confirms the above findings IMPRESSION: 1. Negative for emergent large vessel occlusion. No left MCA branch occlusion identified. 2. Negative CT Perfusion findings. 3. Questionable mild irregularity of distal posterior left MCA branches. Otherwise normal arterial findings on CTA head and neck. 4. Consider PFO or other source of paradoxical emboli in this clinical setting. 5. Preliminary findings of the above discussed by telephone with Dr. Lucia Gaskins on 09/18/2016 at 1011 hours. She wants to pursue limited diffusion MR imaging of the brain, which is pending at this time. Electronically Signed   By: Odessa Fleming M.D.   On: 09/18/2016 10:30   Ct Angio Neck W Or Wo Contrast  Result Date: 09/18/2016 CLINICAL DATA:  25 year old male code stroke. Right side symptoms and abnormal speech. Abnormal posterior left MCA cortex on noncontrast head CT, but appeared more subacute to chronic rather than acute. Initial encounter. EXAM: CT ANGIOGRAPHY HEAD AND NECK CT PERFUSION BRAIN TECHNIQUE:  Multidetector CT perfusion omitted imaging of the brain during bolus IV contrast administration. Post processing using the RAPID software system. Multi detector CT Imaging of the head and neck was performed using the standard protocol during bolus administration of intravenous contrast. Multiplanar CT image reconstructions and MIPs were obtained to evaluate the vascular anatomy. Carotid stenosis measurements (when applicable) are obtained utilizing NASCET criteria, using the distal internal carotid diameter as the denominator. CONTRAST:  50 mL Isovue 370 COMPARISON:  Noncontrast head CT 0935 hours today. FINDINGS: CT BRAIN PERFUSION FINDINGS RAPID post processing of CT perfusion reveals no evidence of cerebral core infarct (designated as CBF less than 30%) or penumbra (designated as Tmax greater than 6 seconds). Cerebral perfusion maps appear essentially symmetric and within normal limits. CTA NECK Skeleton: No acute osseous abnormality identified. Visualized paranasal sinuses and mastoids are stable and well pneumatized. Upper chest: Negative lung apices. No superior mediastinal lymphadenopathy. Other neck: Negative thyroid, larynx, pharynx, parapharyngeal spaces, retropharyngeal space, sublingual space, submandibular glands and parotid glands. No cervical lymphadenopathy. Aortic arch: 3 vessel arch configuration with no  arch or proximal great vessel atherosclerosis or stenosis. Right carotid system: Negative. Left carotid system: Negative. Vertebral arteries:No proximal subclavian artery stenosis. Dominant right vertebral artery. Normal right vertebral artery origin. Normal cervical right vertebral artery. Non dominant left vertebral artery has a normal origin. The left vertebral is somewhat diminutive, but patent and otherwise normal throughout the neck. CTA HEAD Posterior circulation: Dominant distal right vertebral artery is normal. Right PICA origin is normal. Left PICA origin is normal. The left V4 segment is  highly diminutive but patent to the vertebrobasilar junction. Normal basilar artery. Normal SCA and right PCA origins. Fetal type left PCA origin. Right posterior communicating artery also is present. PCA branches are within normal limits. Anterior circulation: Both ICA siphons are patent and normal. Normal ophthalmic and posterior communicating artery origins. Normal carotid termini. Normal MCA and ACA origins. Anterior communicating artery and bilateral ACA branches are normal. Right MCA M1 segment, bifurcation, and right MCA branches are normal. Left MCA M1 segment and bifurcation are normal. Proximal left MCA branches are normal. Questionable mild posterior left in 3 branch irregularity. No left MCA branch occlusion identified. Venous sinuses: Patent. Anatomic variants: Dominant right vertebral artery. Fetal type left PCA origin. Review of the MIP images confirms the above findings IMPRESSION: 1. Negative for emergent large vessel occlusion. No left MCA branch occlusion identified. 2. Negative CT Perfusion findings. 3. Questionable mild irregularity of distal posterior left MCA branches. Otherwise normal arterial findings on CTA head and neck. 4. Consider PFO or other source of paradoxical emboli in this clinical setting. 5. Preliminary findings of the above discussed by telephone with Dr. Lucia Gaskins on 09/18/2016 at 1011 hours. She wants to pursue limited diffusion MR imaging of the brain, which is pending at this time. Electronically Signed   By: Odessa Fleming M.D.   On: 09/18/2016 10:30   Mr Brain Wo Contrast  Result Date: 09/18/2016 CLINICAL DATA:  25 year old male code stroke presentation today with right side symptoms. Subacute to chronic appearing posterior left MCA ischemia on noncontrast CT, with largely negative CTA/CTP findings. Initial encounter. EXAM: MRI HEAD WITHOUT CONTRAST TECHNIQUE: Multiplanar, multiecho pulse sequences of the brain and surrounding structures were obtained without intravenous  contrast. COMPARISON:  CTA, CT perfusion and CT head from today. FINDINGS: Brain: No restricted diffusion or evidence of acute infarction. Mild encephalomalacia with Facilitated cortical diffusion along the posterior left sylvian fissure corresponding to the cortical abnormality on CT today. Elsewhere normal gray and white matter signal. No chronic cerebral blood products identified. No midline shift, mass effect, evidence of mass lesion, ventriculomegaly, extra-axial collection or acute intracranial hemorrhage. Cervicomedullary junction and pituitary are within normal limits. Vascular: Major intracranial vascular flow voids are preserved. Skull and upper cervical spine: Negative. Normal bone marrow signal. Sinuses/Orbits: Negative orbits. Visualized paranasal sinuses and mastoids are stable and well pneumatized. Other: Negative scalp soft tissues. Visible internal auditory structures appear normal. IMPRESSION: 1. Negative for acute infarct or acute intracranial abnormality. 2. Small area of chronic ischemia in the posterior left MCA territory, corresponding to the CT findings today. 3. Preliminary report of the above discussed by telephone with Dr. Lucia Gaskins on 09/18/2016 At 1045 hours. Electronically Signed   By: Odessa Fleming M.D.   On: 09/18/2016 11:07   Ct Cerebral Perfusion W Contrast  Result Date: 09/18/2016 CLINICAL DATA:  25 year old male code stroke. Right side symptoms and abnormal speech. Abnormal posterior left MCA cortex on noncontrast head CT, but appeared more subacute to chronic rather than acute. Initial encounter.  EXAM: CT ANGIOGRAPHY HEAD AND NECK CT PERFUSION BRAIN TECHNIQUE: Multidetector CT perfusion omitted imaging of the brain during bolus IV contrast administration. Post processing using the RAPID software system. Multi detector CT Imaging of the head and neck was performed using the standard protocol during bolus administration of intravenous contrast. Multiplanar CT image reconstructions and  MIPs were obtained to evaluate the vascular anatomy. Carotid stenosis measurements (when applicable) are obtained utilizing NASCET criteria, using the distal internal carotid diameter as the denominator. CONTRAST:  50 mL Isovue 370 COMPARISON:  Noncontrast head CT 0935 hours today. FINDINGS: CT BRAIN PERFUSION FINDINGS RAPID post processing of CT perfusion reveals no evidence of cerebral core infarct (designated as CBF less than 30%) or penumbra (designated as Tmax greater than 6 seconds). Cerebral perfusion maps appear essentially symmetric and within normal limits. CTA NECK Skeleton: No acute osseous abnormality identified. Visualized paranasal sinuses and mastoids are stable and well pneumatized. Upper chest: Negative lung apices. No superior mediastinal lymphadenopathy. Other neck: Negative thyroid, larynx, pharynx, parapharyngeal spaces, retropharyngeal space, sublingual space, submandibular glands and parotid glands. No cervical lymphadenopathy. Aortic arch: 3 vessel arch configuration with no arch or proximal great vessel atherosclerosis or stenosis. Right carotid system: Negative. Left carotid system: Negative. Vertebral arteries:No proximal subclavian artery stenosis. Dominant right vertebral artery. Normal right vertebral artery origin. Normal cervical right vertebral artery. Non dominant left vertebral artery has a normal origin. The left vertebral is somewhat diminutive, but patent and otherwise normal throughout the neck. CTA HEAD Posterior circulation: Dominant distal right vertebral artery is normal. Right PICA origin is normal. Left PICA origin is normal. The left V4 segment is highly diminutive but patent to the vertebrobasilar junction. Normal basilar artery. Normal SCA and right PCA origins. Fetal type left PCA origin. Right posterior communicating artery also is present. PCA branches are within normal limits. Anterior circulation: Both ICA siphons are patent and normal. Normal ophthalmic and  posterior communicating artery origins. Normal carotid termini. Normal MCA and ACA origins. Anterior communicating artery and bilateral ACA branches are normal. Right MCA M1 segment, bifurcation, and right MCA branches are normal. Left MCA M1 segment and bifurcation are normal. Proximal left MCA branches are normal. Questionable mild posterior left in 3 branch irregularity. No left MCA branch occlusion identified. Venous sinuses: Patent. Anatomic variants: Dominant right vertebral artery. Fetal type left PCA origin. Review of the MIP images confirms the above findings IMPRESSION: 1. Negative for emergent large vessel occlusion. No left MCA branch occlusion identified. 2. Negative CT Perfusion findings. 3. Questionable mild irregularity of distal posterior left MCA branches. Otherwise normal arterial findings on CTA head and neck. 4. Consider PFO or other source of paradoxical emboli in this clinical setting. 5. Preliminary findings of the above discussed by telephone with Dr. Lucia Gaskins on 09/18/2016 at 1011 hours. She wants to pursue limited diffusion MR imaging of the brain, which is pending at this time. Electronically Signed   By: Odessa Fleming M.D.   On: 09/18/2016 10:30   Ct Head Code Stroke W/o Cm  Result Date: 09/18/2016 CLINICAL DATA:  Code stroke. 25 year old male with right side weakness and difficulty speaking. Initial encounter. EXAM: CT HEAD WITHOUT CONTRAST TECHNIQUE: Contiguous axial images were obtained from the base of the skull through the vertex without intravenous contrast. COMPARISON:  None. FINDINGS: Brain: Positive for abnormal cortex and subcortical white matter along the posterior left sylvian fissure and the superior lateral left peri-Rolandic cortex. See series 201, images 16- 22. However, at least some of this  appears subacute to chronic. No associated mass effect. Gray-white matter differentiation elsewhere is within normal limits. No acute intracranial hemorrhage identified. No  ventriculomegaly. Vascular: Anterior circulation vasculature appears symmetric without suspicious intracranial vascular hyperdensity. Skull: Visualized osseous structures are within normal limits. Sinuses/Orbits: Clear. Other: Negative orbit and scalp soft tissues. ASPECTS ([place] Stroke Program Early CT Score) The following is assuming all of the posterior left MCA territory abnormal cortex is acute rather than subacute to chronic. - Ganglionic level infarction (caudate, lentiform nuclei, internal capsule, insula, M1-M3 cortex): 7 - Supraganglionic infarction (M4-M6 cortex): 1 Total score (0-10 with 10 being normal): 8 IMPRESSION: 1. Evidence of cortically based infarct in the posterior left MCA territory, but appears more subacute to chronic than acute. No associated hemorrhage or mass effect. No other changes of acute cortically based infarct. 2. ASPECTS is 8, BUT this is assuming that all of the findings in #1 are acute. 3. Study discussed by telephone with Dr. Lucia Gaskins on 09/18/2016 at 09:51 . Electronically Signed   By: Odessa Fleming M.D.   On: 09/18/2016 09:52    EKG: Independently reviewed.   Assessment/Plan Active Problems:   Right sided weakness   Stroke-like symptoms   Essential hypertension   S/P laparoscopic sleeve gastrectomy   History of CVA (cerebrovascular accident)   Renal insufficiency   Strokelike symptoms: History of stroke in 2016 with right hand numbness residual deficit. At presentation patient with right facial numbness and droop in V2 V3 distribution, right upper extremity and right lower extremity weakness and numbness. Gradually improving per patient. MRI showing "area of chronic ischemia in the posterior left MCA territory." CTA head and neck showing " Questionable mild irregularity of distal posterior left MCA branches." - tele obs - Following appreciate recommendations  - Initiate aspirin and Plavix (patella. The risks at least in the short-term given patient's history of  previous stroke, current symptoms, and history of gastric sleeve bypass.). Will need to make long-term decision for antiplatelet therapy prior to discharge. Discussed briefly with GI but would benefit from CCS input. - CTA head and neck, echo - PT/OT - Risk stratification labs - Permissive hypertension  Laproscopic sleeve gastrectomy: Performed in 2014??? No history of GI bleed or significant GERD. - Aspirin Plavix above - Start Pepcid  CP: Likely stress related versus GI versus MSK versus cardiac. EKG without signs of ACS, troponin negative. Currently resolved. - Telemetry, cycle troponins, EKG in a.m. - GI cocktail - Echo as above  Renal insufficiency: Creatinine 1.3 on admission. Unsure of baseline. No old records to compare. AK I versus C KD. The need to trend values in order to make full diagnoses. - IVF - BMP in a.m.  DVT prophylaxis: Hep  Code Status: FULL  Family Communication: ex-girlfriend - per pt request  Disposition Plan: obs for stroke workup.  Consults called: neuro  Admission status: obs - tele    Jerimie Mancuso J MD [facility]  If 7PM-7AM, please contact night-coverage [email or web address] Password [email or web address]  09/18/2016, 1:06 PM

## 2016-09-18 NOTE — ED Notes (Signed)
Pt given urinal. Pt sat up on side of bed unassisted. Pt tolerated well.

## 2016-09-18 NOTE — ED Notes (Signed)
Pt back in room from MRI and placed on monitor. Pt continues to have slurred speech. Pt can raise right arm no drift and right leg no drift. Pt's right arm grip weak. Left side continues to be WNL

## 2016-09-18 NOTE — Consult Note (Signed)
Requesting Physician: Dr. Rush Landmarkegeler    Chief Complaint: right arm weakness  History obtained from:  Patient     HPI:                                                                                                                                         Ryan Howard is an 25 y.o. male who is brought to [facility] department as a code stroke. Patient was last seen normal at 9 AM while he was in class when suddenly he felt that his right arm became suddenly weak and he could not move it along with his speech became altered. On arrival patient had mumbled speech and was not moving his right arm or leg. By the time he arrived at CT scanner which is approximately 10 minutes later patient was moving his right arm and right leg antigravity. Patient continued to have mumbled speech. Patient was able to follow all commands and was trying to express himself however due to the month mumbled speech she was intelligible. He did state that he was recently seen in [place] July 2016 and had a stroke. Although they are in Epic if we were unable to pull his records but were able to get in contact to the hospital who faxed his records. During exam it was noted patient had multiple functional qualities along with inconsistent effort.  Patient was [facility] at [facility] on 06/[facility].  At time of arrival to the hospital for code stroke patient had a CTA of brain and neck that showed significant calcification along the falx but no acute abnormalities. His NIH stroke scale was 7--2 for right facial palsy, 1 for right arm drift, 1 for right leg drift, 1 for sensory loss on the right, 1 for dysarthria, and 1 for aphasia. Patient underwent an MRI brain which showed an acute infarct in the left inferior parietal lobe with involvement of left postcentral gyrus.  Patient did receive TPA at that time and no complications. At that time he had an echocardiogram that was performed and revealed moderate global hypokinesis of the  left ventricle with moderately reduced left ventricular systolic function and an EF of only 25%. Patient was placed on Coreg. At time of discharge patient had only dysarthria and mild right upper extremity weakness. Patient was discharged on aspirin and Plavix.  Due to patient's inconsistency on exam patient underwent a CTA of head and neck along with perfusion scan. These results were normal. Patient then was brought MRI for a limited diffusion scan. Diffusion scan showed no stroke.   Date last known well: Date: 09/18/2016 Time last known well: Time: 09:00 tPA Given: No: Improving symptoms and minimal NIH   Past Medical History:  Diagnosis Date  . CVA (cerebral vascular accident) (HCC)   . Obesity   . 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  Stroke [facility])     Past Surgical History:  Procedure Laterality Date  . Laparoscopic sleeve gastrectomy      No family history on file. Social History:  reports that he has never smoked. He has never used smokeless tobacco. He reports that he does not drink alcohol or use drugs.  Allergies: No Known Allergies  Medications:                                                                                                                           Current Facility-Administered Medications  Medication Dose Route Frequency Provider Last Rate Last Dose  . iopamidol (ISOVUE-370) 76 % injection            No current outpatient prescriptions on file.     ROS:                                                                                                                                       History obtained from the patient  General ROS: negative for - chills, fatigue, fever, night sweats, weight gain or weight loss Psychological ROS: negative for - behavioral disorder, hallucinations, memory difficulties, mood swings or suicidal ideation Ophthalmic ROS: negative for - blurry vision, double vision, eye pain  or loss of vision ENT ROS: negative for - epistaxis, nasal discharge, oral lesions, sore throat, tinnitus or vertigo Allergy and Immunology ROS: negative for - hives or itchy/watery eyes Hematological and Lymphatic ROS: negative for - bleeding problems, bruising or swollen lymph nodes Endocrine ROS: negative for - galactorrhea, hair pattern changes, polydipsia/polyuria or temperature intolerance Respiratory ROS: negative for - cough, hemoptysis, shortness of breath or wheezing Cardiovascular ROS: negative for - chest pain, dyspnea on exertion, edema or irregular heartbeat Gastrointestinal ROS: negative for - abdominal pain, diarrhea, hematemesis, nausea/vomiting or stool incontinence Genito-Urinary ROS: negative for - dysuria, hematuria, incontinence or urinary frequency/urgency Musculoskeletal ROS: negative for - joint swelling or muscular weakness Neurological ROS: as noted in HPI Dermatological ROS: negative for rash and skin lesion changes  Neurologic Examination:  Blood pressure 161/100, pulse 68, temperature 97.8 F (36.6 C), temperature source Oral, resp. rate 15, height 6\' 2"  (1.88 m), weight 94.4 kg (208 lb 1.8 oz), SpO2 100 %.  HEENT-  Normocephalic, no lesions, without obvious abnormality.  Normal external eye and conjunctiva.  Normal TM's bilaterally.  Normal auditory canals and external ears. Normal external nose, mucus membranes and septum.  Normal pharynx. Cardiovascular- S1, S2 normal, pulses palpable throughout   Lungs- chest clear, no wheezing, rales, normal symmetric air entry Abdomen- normal findings: bowel sounds normal Extremities- no edema Lymph-no adenopathy palpable Musculoskeletal-no joint tenderness, deformity or swelling Skin-warm and dry, no hyperpigmentation, vitiligo, or suspicious lesions  Neurological Examination Mental Status: Alert, oriented, thought  content appropriate.  Speech dysarthric initially however no aphasia was noted. As time went on dysarthria improved.   Able to follow 3 step commands without difficulty. Cranial Nerves: II: Visual fields grossly normal, pupils equal, round, reactive to light and accommodation III,IV, VI: ptosis not present, extra-ocular motions intact bilaterally V,VII: smile symmetric, facial light touch sensation normal bilaterally--at times patient would state he could not move the right side of his face however when not paying attention he was able to. There are other times when patient stated he just cannot move his jaw at all. He was significant functional component when testing cranial nerves. VIII: hearing normal bilaterally IX,X: uvula rises symmetrically XI: bilateral shoulder shrug XII: midline tongue extension Motor: Right : Upper extremity   4/5 --see note  Left:     Upper extremity   5/5  Lower extremity   -/5     Lower extremity   5/5 -On exam patient showed multiple functional components including not allowing his right hand to fall over his head, positive Hoover's, give way strength.  Tone and bulk:normal tone throughout; no atrophy noted Sensory: Pinprick and light touch intact throughout, bilaterally Deep Tendon Reflexes: 2+ and symmetric throughout Plantars: Right: downgoing   Left: downgoing Cerebellar: normal finger-to-nose on the left and normal heel-to-shin test on the left Gait: Not tested       Lab Results: Basic Metabolic Panel:  Recent Labs Lab 09/18/16 0925 09/18/16 0944  NA 141 143  K 3.5 3.4*  CL 107 103  CO2 26  --   GLUCOSE 85 81  BUN 9 10  CREATININE 1.34* 1.30*  CALCIUM 9.7  --     Liver Function Tests:  Recent Labs Lab 09/18/16 0925  AST 23  ALT 25  ALKPHOS 36*  BILITOT 1.0  PROT 7.2  ALBUMIN 4.2   No results for input(s): LIPASE, AMYLASE in the last 168 hours. No results for input(s): AMMONIA in the last 168 hours.  CBC:  Recent  Labs Lab 09/18/16 0925 09/18/16 0944  WBC 7.2  --   NEUTROABS 2.9  --   HGB 14.7 15.3  HCT 42.1 45.0  MCV 90.9  --   PLT 168  --     Cardiac Enzymes: No results for input(s): CKTOTAL, CKMB, CKMBINDEX, TROPONINI in the last 168 hours.  Lipid Panel: No results for input(s): CHOL, TRIG, HDL, CHOLHDL, VLDL, LDLCALC in the last 168 hours.  CBG:  Recent Labs Lab 09/18/16 0937  GLUCAP 84    Microbiology: No results found for this or any previous visit.  Coagulation Studies:  Recent Labs  09/18/16 0925  LABPROT 14.6  INR 1.14    Imaging: Ct Angio Head W Or Wo Contrast  Result Date: 09/18/2016 CLINICAL DATA:  25 year old male code stroke. Right  side symptoms and abnormal speech. Abnormal posterior left MCA cortex on noncontrast head CT, but appeared more subacute to chronic rather than acute. Initial encounter. EXAM: CT ANGIOGRAPHY HEAD AND NECK CT PERFUSION BRAIN TECHNIQUE: Multidetector CT perfusion omitted imaging of the brain during bolus IV contrast administration. Post processing using the RAPID software system. Multi detector CT Imaging of the head and neck was performed using the standard protocol during bolus administration of intravenous contrast. Multiplanar CT image reconstructions and MIPs were obtained to evaluate the vascular anatomy. Carotid stenosis measurements (when applicable) are obtained utilizing NASCET criteria, using the distal internal carotid diameter as the denominator. CONTRAST:  50 mL Isovue 370 COMPARISON:  Noncontrast head CT 0935 hours today. FINDINGS: CT BRAIN PERFUSION FINDINGS RAPID post processing of CT perfusion reveals no evidence of cerebral core infarct (designated as CBF less than 30%) or penumbra (designated as Tmax greater than 6 seconds). Cerebral perfusion maps appear essentially symmetric and within normal limits. CTA NECK Skeleton: No acute osseous abnormality identified. Visualized paranasal sinuses and mastoids are stable and well  pneumatized. Upper chest: Negative lung apices. No superior mediastinal lymphadenopathy. Other neck: Negative thyroid, larynx, pharynx, parapharyngeal spaces, retropharyngeal space, sublingual space, submandibular glands and parotid glands. No cervical lymphadenopathy. Aortic arch: 3 vessel arch configuration with no arch or proximal great vessel atherosclerosis or stenosis. Right carotid system: Negative. Left carotid system: Negative. Vertebral arteries:No proximal subclavian artery stenosis. Dominant right vertebral artery. Normal right vertebral artery origin. Normal cervical right vertebral artery. Non dominant left vertebral artery has a normal origin. The left vertebral is somewhat diminutive, but patent and otherwise normal throughout the neck. CTA HEAD Posterior circulation: Dominant distal right vertebral artery is normal. Right PICA origin is normal. Left PICA origin is normal. The left V4 segment is highly diminutive but patent to the vertebrobasilar junction. Normal basilar artery. Normal SCA and right PCA origins. Fetal type left PCA origin. Right posterior communicating artery also is present. PCA branches are within normal limits. Anterior circulation: Both ICA siphons are patent and normal. Normal ophthalmic and posterior communicating artery origins. Normal carotid termini. Normal MCA and ACA origins. Anterior communicating artery and bilateral ACA branches are normal. Right MCA M1 segment, bifurcation, and right MCA branches are normal. Left MCA M1 segment and bifurcation are normal. Proximal left MCA branches are normal. Questionable mild posterior left in 3 branch irregularity. No left MCA branch occlusion identified. Venous sinuses: Patent. Anatomic variants: Dominant right vertebral artery. Fetal type left PCA origin. Review of the MIP images confirms the above findings IMPRESSION: 1. Negative for emergent large vessel occlusion. No left MCA branch occlusion identified. 2. Negative CT  Perfusion findings. 3. Questionable mild irregularity of distal posterior left MCA branches. Otherwise normal arterial findings on CTA head and neck. 4. Consider PFO or other source of paradoxical emboli in this clinical setting. 5. Preliminary findings of the above discussed by telephone with Dr. Lucia Gaskins on 09/18/2016 at 1011 hours. She wants to pursue limited diffusion MR imaging of the brain, which is pending at this time. Electronically Signed   By: Odessa Fleming M.D.   On: 09/18/2016 10:30   Ct Angio Neck W Or Wo Contrast  Result Date: 09/18/2016 CLINICAL DATA:  25 year old male code stroke. Right side symptoms and abnormal speech. Abnormal posterior left MCA cortex on noncontrast head CT, but appeared more subacute to chronic rather than acute. Initial encounter. EXAM: CT ANGIOGRAPHY HEAD AND NECK CT PERFUSION BRAIN TECHNIQUE: Multidetector CT perfusion omitted imaging of the brain  during bolus IV contrast administration. Post processing using the RAPID software system. Multi detector CT Imaging of the head and neck was performed using the standard protocol during bolus administration of intravenous contrast. Multiplanar CT image reconstructions and MIPs were obtained to evaluate the vascular anatomy. Carotid stenosis measurements (when applicable) are obtained utilizing NASCET criteria, using the distal internal carotid diameter as the denominator. CONTRAST:  50 mL Isovue 370 COMPARISON:  Noncontrast head CT 0935 hours today. FINDINGS: CT BRAIN PERFUSION FINDINGS RAPID post processing of CT perfusion reveals no evidence of cerebral core infarct (designated as CBF less than 30%) or penumbra (designated as Tmax greater than 6 seconds). Cerebral perfusion maps appear essentially symmetric and within normal limits. CTA NECK Skeleton: No acute osseous abnormality identified. Visualized paranasal sinuses and mastoids are stable and well pneumatized. Upper chest: Negative lung apices. No superior mediastinal  lymphadenopathy. Other neck: Negative thyroid, larynx, pharynx, parapharyngeal spaces, retropharyngeal space, sublingual space, submandibular glands and parotid glands. No cervical lymphadenopathy. Aortic arch: 3 vessel arch configuration with no arch or proximal great vessel atherosclerosis or stenosis. Right carotid system: Negative. Left carotid system: Negative. Vertebral arteries:No proximal subclavian artery stenosis. Dominant right vertebral artery. Normal right vertebral artery origin. Normal cervical right vertebral artery. Non dominant left vertebral artery has a normal origin. The left vertebral is somewhat diminutive, but patent and otherwise normal throughout the neck. CTA HEAD Posterior circulation: Dominant distal right vertebral artery is normal. Right PICA origin is normal. Left PICA origin is normal. The left V4 segment is highly diminutive but patent to the vertebrobasilar junction. Normal basilar artery. Normal SCA and right PCA origins. Fetal type left PCA origin. Right posterior communicating artery also is present. PCA branches are within normal limits. Anterior circulation: Both ICA siphons are patent and normal. Normal ophthalmic and posterior communicating artery origins. Normal carotid termini. Normal MCA and ACA origins. Anterior communicating artery and bilateral ACA branches are normal. Right MCA M1 segment, bifurcation, and right MCA branches are normal. Left MCA M1 segment and bifurcation are normal. Proximal left MCA branches are normal. Questionable mild posterior left in 3 branch irregularity. No left MCA branch occlusion identified. Venous sinuses: Patent. Anatomic variants: Dominant right vertebral artery. Fetal type left PCA origin. Review of the MIP images confirms the above findings IMPRESSION: 1. Negative for emergent large vessel occlusion. No left MCA branch occlusion identified. 2. Negative CT Perfusion findings. 3. Questionable mild irregularity of distal posterior left  MCA branches. Otherwise normal arterial findings on CTA head and neck. 4. Consider PFO or other source of paradoxical emboli in this clinical setting. 5. Preliminary findings of the above discussed by telephone with Dr. Lucia Gaskins on 09/18/2016 at 1011 hours. She wants to pursue limited diffusion MR imaging of the brain, which is pending at this time. Electronically Signed   By: Odessa Fleming M.D.   On: 09/18/2016 10:30   Mr Brain Wo Contrast  Result Date: 09/18/2016 CLINICAL DATA:  25 year old male code stroke presentation today with right side symptoms. Subacute to chronic appearing posterior left MCA ischemia on noncontrast CT, with largely negative CTA/CTP findings. Initial encounter. EXAM: MRI HEAD WITHOUT CONTRAST TECHNIQUE: Multiplanar, multiecho pulse sequences of the brain and surrounding structures were obtained without intravenous contrast. COMPARISON:  CTA, CT perfusion and CT head from today. FINDINGS: Brain: No restricted diffusion or evidence of acute infarction. Mild encephalomalacia with Facilitated cortical diffusion along the posterior left sylvian fissure corresponding to the cortical abnormality on CT today. Elsewhere normal gray and white  matter signal. No chronic cerebral blood products identified. No midline shift, mass effect, evidence of mass lesion, ventriculomegaly, extra-axial collection or acute intracranial hemorrhage. Cervicomedullary junction and pituitary are within normal limits. Vascular: Major intracranial vascular flow voids are preserved. Skull and upper cervical spine: Negative. Normal bone marrow signal. Sinuses/Orbits: Negative orbits. Visualized paranasal sinuses and mastoids are stable and well pneumatized. Other: Negative scalp soft tissues. Visible internal auditory structures appear normal. IMPRESSION: 1. Negative for acute infarct or acute intracranial abnormality. 2. Small area of chronic ischemia in the posterior left MCA territory, corresponding to the CT findings today.  3. Preliminary report of the above discussed by telephone with Dr. Lucia Gaskins on 09/18/2016 At 1045 hours. Electronically Signed   By: Odessa Fleming M.D.   On: 09/18/2016 11:07   Ct Cerebral Perfusion W Contrast  Result Date: 09/18/2016 CLINICAL DATA:  25 year old male code stroke. Right side symptoms and abnormal speech. Abnormal posterior left MCA cortex on noncontrast head CT, but appeared more subacute to chronic rather than acute. Initial encounter. EXAM: CT ANGIOGRAPHY HEAD AND NECK CT PERFUSION BRAIN TECHNIQUE: Multidetector CT perfusion omitted imaging of the brain during bolus IV contrast administration. Post processing using the RAPID software system. Multi detector CT Imaging of the head and neck was performed using the standard protocol during bolus administration of intravenous contrast. Multiplanar CT image reconstructions and MIPs were obtained to evaluate the vascular anatomy. Carotid stenosis measurements (when applicable) are obtained utilizing NASCET criteria, using the distal internal carotid diameter as the denominator. CONTRAST:  50 mL Isovue 370 COMPARISON:  Noncontrast head CT 0935 hours today. FINDINGS: CT BRAIN PERFUSION FINDINGS RAPID post processing of CT perfusion reveals no evidence of cerebral core infarct (designated as CBF less than 30%) or penumbra (designated as Tmax greater than 6 seconds). Cerebral perfusion maps appear essentially symmetric and within normal limits. CTA NECK Skeleton: No acute osseous abnormality identified. Visualized paranasal sinuses and mastoids are stable and well pneumatized. Upper chest: Negative lung apices. No superior mediastinal lymphadenopathy. Other neck: Negative thyroid, larynx, pharynx, parapharyngeal spaces, retropharyngeal space, sublingual space, submandibular glands and parotid glands. No cervical lymphadenopathy. Aortic arch: 3 vessel arch configuration with no arch or proximal great vessel atherosclerosis or stenosis. Right carotid system:  Negative. Left carotid system: Negative. Vertebral arteries:No proximal subclavian artery stenosis. Dominant right vertebral artery. Normal right vertebral artery origin. Normal cervical right vertebral artery. Non dominant left vertebral artery has a normal origin. The left vertebral is somewhat diminutive, but patent and otherwise normal throughout the neck. CTA HEAD Posterior circulation: Dominant distal right vertebral artery is normal. Right PICA origin is normal. Left PICA origin is normal. The left V4 segment is highly diminutive but patent to the vertebrobasilar junction. Normal basilar artery. Normal SCA and right PCA origins. Fetal type left PCA origin. Right posterior communicating artery also is present. PCA branches are within normal limits. Anterior circulation: Both ICA siphons are patent and normal. Normal ophthalmic and posterior communicating artery origins. Normal carotid termini. Normal MCA and ACA origins. Anterior communicating artery and bilateral ACA branches are normal. Right MCA M1 segment, bifurcation, and right MCA branches are normal. Left MCA M1 segment and bifurcation are normal. Proximal left MCA branches are normal. Questionable mild posterior left in 3 branch irregularity. No left MCA branch occlusion identified. Venous sinuses: Patent. Anatomic variants: Dominant right vertebral artery. Fetal type left PCA origin. Review of the MIP images confirms the above findings IMPRESSION: 1. Negative for emergent large vessel occlusion. No left MCA  branch occlusion identified. 2. Negative CT Perfusion findings. 3. Questionable mild irregularity of distal posterior left MCA branches. Otherwise normal arterial findings on CTA head and neck. 4. Consider PFO or other source of paradoxical emboli in this clinical setting. 5. Preliminary findings of the above discussed by telephone with Dr. Lucia Gaskins on 09/18/2016 at 1011 hours. She wants to pursue limited diffusion MR imaging of the brain, which is  pending at this time. Electronically Signed   By: Odessa Fleming M.D.   On: 09/18/2016 10:30   Ct Head Code Stroke W/o Cm  Result Date: 09/18/2016 CLINICAL DATA:  Code stroke. 25 year old male with right side weakness and difficulty speaking. Initial encounter. EXAM: CT HEAD WITHOUT CONTRAST TECHNIQUE: Contiguous axial images were obtained from the base of the skull through the vertex without intravenous contrast. COMPARISON:  None. FINDINGS: Brain: Positive for abnormal cortex and subcortical white matter along the posterior left sylvian fissure and the superior lateral left peri-Rolandic cortex. See series 201, images 16- 22. However, at least some of this appears subacute to chronic. No associated mass effect. Gray-white matter differentiation elsewhere is within normal limits. No acute intracranial hemorrhage identified. No ventriculomegaly. Vascular: Anterior circulation vasculature appears symmetric without suspicious intracranial vascular hyperdensity. Skull: Visualized osseous structures are within normal limits. Sinuses/Orbits: Clear. Other: Negative orbit and scalp soft tissues. ASPECTS ([place] Stroke Program Early CT Score) The following is assuming all of the posterior left MCA territory abnormal cortex is acute rather than subacute to chronic. - Ganglionic level infarction (caudate, lentiform nuclei, internal capsule, insula, M1-M3 cortex): 7 - Supraganglionic infarction (M4-M6 cortex): 1 Total score (0-10 with 10 being normal): 8 IMPRESSION: 1. Evidence of cortically based infarct in the posterior left MCA territory, but appears more subacute to chronic than acute. No associated hemorrhage or mass effect. No other changes of acute cortically based infarct. 2. ASPECTS is 8, BUT this is assuming that all of the findings in #1 are acute. 3. Study discussed by telephone with Dr. Lucia Gaskins on 09/18/2016 at 09:51 . Electronically Signed   By: Odessa Fleming M.D.   On: 09/18/2016 09:52         09/18/2016, 11:30  AM   Assessment: 25 y.o. male resenting him to [facility] emerged from and as a code stroke. Patient had sudden onset of dysarthria, right arm and leg weakness. Patient has had a history of right parietal stroke back in 2016 however currently he is on no antiplatelets or medications. Patient underwent CT of head, CTA of head and neck, cerebral perfusion scan, along with MRI of brain which showed no acute intracranial abnormalities. Patient's exam was positive for improving dysarthria along with intermittent, waxing and waning right arm and leg weakness. Exam was notable for multiple functional components.  Stroke Risk Factors - stroke  Recommendation: 1. HgbA1c, fasting lipid panel 2. PT consult, OT consult, Speech consult 3. Echocardiogram 4. Prophylactic therapy-Antiplatelet med: Aspirin - dose 325 mg daily 5. Risk factor modification 6. Telemetry monitoring 7. Frequent neuro checks 8 NPO until passes stroke swallow screen 9 hypercoagulable panel 10 please page stroke NP  Or  PA  Or MD from 8am -4 pm  as this patient from this time will be  followed by the stroke.   You can look them up on [email or web address]  Password [email or web address]    Assessment and plan discussed with with attending physician and they are in agreement.    Felicie Morn PA-C [facility] [phone]   Personally examined patient and images,  and have participated in and made any corrections needed to history, physical, neuro exam,assessment and plan as stated above.  I have personally obtained the history, evaluated lab date, reviewed imaging studies and agree with radiology interpretations. Patient is a 25 year old gentleman with acute onset slurred speech and right-sided hemiparesis and hemisensory loss. Ct of the head showed a posterior left MCA infarct which was concerning given new symptoms. Patient's exam was inconsistent with poor effort and MRI was obtained which showed no acute stroke. However given his past history of  stroke with unclear etiology and echocardiogram that was performed in the past and revealed moderate global hypokinesis of the left ventricle with moderately reduced left ventricular systolic function and an EF of only 25% feel further workup indicated for TIA.    Naomie Dean, MD Stroke Neurology [phone] [facility]

## 2016-09-18 NOTE — ED Notes (Signed)
Pt improving, able to move right arm and leg. No drift in right arm. Pt still having slurred speech.

## 2016-09-19 DIAGNOSIS — I10 Essential (primary) hypertension: Secondary | ICD-10-CM | POA: Diagnosis not present

## 2016-09-19 DIAGNOSIS — R2990 Unspecified symptoms and signs involving the nervous system: Secondary | ICD-10-CM

## 2016-09-19 DIAGNOSIS — R531 Weakness: Principal | ICD-10-CM

## 2016-09-19 DIAGNOSIS — R079 Chest pain, unspecified: Secondary | ICD-10-CM | POA: Diagnosis not present

## 2016-09-19 DIAGNOSIS — Z8673 Personal history of transient ischemic attack (TIA), and cerebral infarction without residual deficits: Secondary | ICD-10-CM

## 2016-09-19 DIAGNOSIS — R931 Abnormal findings on diagnostic imaging of heart and coronary circulation: Secondary | ICD-10-CM | POA: Diagnosis not present

## 2016-09-19 DIAGNOSIS — R200 Anesthesia of skin: Secondary | ICD-10-CM

## 2016-09-19 DIAGNOSIS — I428 Other cardiomyopathies: Secondary | ICD-10-CM | POA: Diagnosis not present

## 2016-09-19 DIAGNOSIS — Z9884 Bariatric surgery status: Secondary | ICD-10-CM

## 2016-09-19 DIAGNOSIS — R471 Dysarthria and anarthria: Secondary | ICD-10-CM | POA: Diagnosis not present

## 2016-09-19 DIAGNOSIS — N289 Disorder of kidney and ureter, unspecified: Secondary | ICD-10-CM

## 2016-09-19 LAB — LIPID PANEL
CHOL/HDL RATIO: 4.7 ratio
CHOLESTEROL: 185 mg/dL (ref 0–200)
HDL: 39 mg/dL — ABNORMAL LOW (ref >40)
LDL Cholesterol: 127 mg/dL — ABNORMAL HIGH (ref 0–99)
TRIGLYCERIDES: 94 mg/dL (ref <150)
VLDL: 19 mg/dL (ref 0–40)

## 2016-09-19 LAB — BETA-2-GLYCOPROTEIN I ABS, IGG/M/A
Beta-2 Glyco I IgG: <9 GPI IgG units (ref 0–20)
Beta-2-Glycoprotein I IgA: <9 GPI IgA units (ref 0–25)
Beta-2-Glycoprotein I IgM: <9 GPI IgM units (ref 0–32)

## 2016-09-19 LAB — PROTEIN C, TOTAL: PROTEIN C, TOTAL: 98 % (ref 60–150)

## 2016-09-19 LAB — CBC
HCT: 38.2 % — ABNORMAL LOW (ref 39.0–52.0)
Hemoglobin: 13.2 g/dL (ref 13.0–17.0)
MCH: 31 pg (ref 26.0–34.0)
MCHC: 34.6 g/dL (ref 30.0–36.0)
MCV: 89.7 fL (ref 78.0–100.0)
PLATELETS: 179 10*3/uL (ref 150–400)
RBC: 4.26 MIL/uL (ref 4.22–5.81)
RDW: 12.8 % (ref 11.5–15.5)
WBC: 5.9 10*3/uL (ref 4.0–10.5)

## 2016-09-19 LAB — TROPONIN I

## 2016-09-19 LAB — COMPREHENSIVE METABOLIC PANEL
ALT: 21 U/L (ref 17–63)
ANION GAP: 7 (ref 5–15)
AST: 18 U/L (ref 15–41)
Albumin: 3.7 g/dL (ref 3.5–5.0)
Alkaline Phosphatase: 34 U/L — ABNORMAL LOW (ref 38–126)
BUN: 7 mg/dL (ref 6–20)
CHLORIDE: 107 mmol/L (ref 101–111)
CO2: 27 mmol/L (ref 22–32)
CREATININE: 1.17 mg/dL (ref 0.61–1.24)
Calcium: 9 mg/dL (ref 8.9–10.3)
Glucose, Bld: 89 mg/dL (ref 65–99)
POTASSIUM: 3.4 mmol/L — AB (ref 3.5–5.1)
SODIUM: 141 mmol/L (ref 135–145)
Total Bilirubin: 0.8 mg/dL (ref 0.3–1.2)
Total Protein: 6.4 g/dL — ABNORMAL LOW (ref 6.5–8.1)

## 2016-09-19 LAB — CARDIOLIPIN ANTIBODIES, IGG, IGM, IGA: Anticardiolipin IgG: 13 GPL U/mL (ref 0–14)

## 2016-09-19 LAB — HOMOCYSTEINE: Homocysteine: 16.6 umol/L — ABNORMAL HIGH (ref 0.0–15.0)

## 2016-09-19 MED ORDER — POTASSIUM CHLORIDE CRYS ER 20 MEQ PO TBCR
40.0000 meq | EXTENDED_RELEASE_TABLET | Freq: Once | ORAL | Status: AC
  Administered 2016-09-19: 40 meq via ORAL
  Filled 2016-09-19: qty 2

## 2016-09-19 NOTE — Progress Notes (Signed)
PROGRESS NOTE    Ryan Howard  FIE:[id number] DOB: 12/29/1990 DOA: 09/18/2016 PCP: No primary care provider on file.   Brief Narrative: Ryan Howard is a 25 y.o. male with medical history significant of stroke, morbid obesity status post gastric sleeve procedure, ?cardiomyopathy. He presented with chest pain, palpitations and right sided numbness/weakness. Evaluation for CVA has been negative.   Assessment & Plan:   Active Problems:   Right sided weakness   Stroke-like symptoms   Essential hypertension   S/P laparoscopic sleeve gastrectomy   History of CVA (cerebrovascular accident)   Renal insufficiency   Stroke-like symptoms Right sided weakness History of CVA Deficits appear resolved today. Workup so far has been negative. Echo pending -follow-up echo -neurology recommendations -PT/OT recommendations  Laparoscopic sleeve gastrectomy -continue pepcid  Chest pain Resolved. Troponin negative. EKG unremarkable for ACS signs  Renal insufficiency Creatinine improved. Likely acute renal failure secondary to prerenal azotemia  History of cardiomyopathy Per patient, has history of EF of 25%. He has not been managed for this. Euvolemic -echo pending.   DVT prophylaxis: Heparin Code Status: Full code Family Communication: None at bedside Disposition Plan: Discharge home tomorrow   Consultants:   Neurology  Procedures:  None  Antimicrobials:  None    Subjective: Patient reports no issues overnight. He has no residual symptoms.  Objective: Vitals:   09/19/16 0227 09/19/16 0428 09/19/16 0635 09/19/16 0931  BP: (!) 147/79 139/67 135/73 129/74  Pulse: 61 (!) 56 76 (!) 54  Resp: 17 15 15 18   Temp: 97.4 F (36.3 C) 97.6 F (36.4 C) 97.9 F (36.6 C) 97.8 F (36.6 C)  TempSrc: Oral Oral Oral   SpO2: 99% 98% 97% 96%  Weight:      Height:        Intake/Output Summary (Last 24 hours) at 09/19/16 1344 Last data filed at 09/19/16 1110  Gross per 24  hour  Intake              300 ml  Output             2050 ml  Net            -1750 ml   Filed Weights   09/18/16 0900 09/18/16 1104 09/18/16 1820  Weight: 94.4 kg (208 lb 1.8 oz) 94.4 kg (208 lb 1.8 oz) 93.8 kg (206 lb 14.4 oz)    Examination:  General exam: Appears calm and comfortable Respiratory system: Clear to auscultation. Respiratory effort normal. Cardiovascular system: S1 & S2 heard, RRR. No murmurs, rubs, gallops or clicks. Gastrointestinal system: Abdomen is nondistended, soft and nontender. No organomegaly or masses felt. Normal bowel sounds heard. Central nervous system: Alert and oriented. No focal neurological deficits. Extremities: No edema. No calf tenderness Skin: No cyanosis. No rashes Psychiatry: Judgement and insight appear normal. Mood & affect appropriate.     Data Reviewed: I have personally reviewed following labs and imaging studies  CBC:  Recent Labs Lab 09/18/16 0925 09/18/16 0944 09/19/16 0002  WBC 7.2  --  5.9  NEUTROABS 2.9  --   --   HGB 14.7 15.3 13.2  HCT 42.1 45.0 38.2*  MCV 90.9  --  89.7  PLT 168  --  179   Basic Metabolic Panel:  Recent Labs Lab 09/18/16 0925 09/18/16 0944 09/19/16 0002  NA 141 143 141  K 3.5 3.4* 3.4*  CL 107 103 107  CO2 26  --  27  GLUCOSE 85 81 89  BUN 9 10 7  CREATININE 1.34* 1.30* 1.17  CALCIUM 9.7  --  9.0   GFR: Estimated Creatinine Clearance: 112.2 mL/min (by C-G formula based on SCr of 1.17 mg/dL). Liver Function Tests:  Recent Labs Lab 09/18/16 0925 09/19/16 0002  AST 23 18  ALT 25 21  ALKPHOS 36* 34*  BILITOT 1.0 0.8  PROT 7.2 6.4*  ALBUMIN 4.2 3.7   No results for input(s): LIPASE, AMYLASE in the last 168 hours. No results for input(s): AMMONIA in the last 168 hours. Coagulation Profile:  Recent Labs Lab 09/18/16 0925  INR 1.14   Cardiac Enzymes:  Recent Labs Lab 09/18/16 1629 09/18/16 2054 09/19/16 0002  TROPONINI <0.03 <0.03 <0.03   BNP (last 3 results) No  results for input(s): PROBNP in the last 8760 hours. HbA1C: No results for input(s): HGBA1C in the last 72 hours. CBG:  Recent Labs Lab 09/18/16 0937  GLUCAP 84   Lipid Profile:  Recent Labs  09/19/16 0002  CHOL 185  HDL 39*  LDLCALC 127*  TRIG 94  CHOLHDL 4.7   Thyroid Function Tests: No results for input(s): TSH, T4TOTAL, FREET4, T3FREE, THYROIDAB in the last 72 hours. Anemia Panel: No results for input(s): VITAMINB12, FOLATE, FERRITIN, TIBC, IRON, RETICCTPCT in the last 72 hours. Sepsis Labs: No results for input(s): PROCALCITON, LATICACIDVEN in the last 168 hours.  No results found for this or any previous visit (from the past 240 hour(s)).       Radiology Studies: Ct Angio Head W Or Wo Contrast  Result Date: 09/18/2016 CLINICAL DATA:  25 year old male code stroke. Right side symptoms and abnormal speech. Abnormal posterior left MCA cortex on noncontrast head CT, but appeared more subacute to chronic rather than acute. Initial encounter. EXAM: CT ANGIOGRAPHY HEAD AND NECK CT PERFUSION BRAIN TECHNIQUE: Multidetector CT perfusion omitted imaging of the brain during bolus IV contrast administration. Post processing using the RAPID software system. Multi detector CT Imaging of the head and neck was performed using the standard protocol during bolus administration of intravenous contrast. Multiplanar CT image reconstructions and MIPs were obtained to evaluate the vascular anatomy. Carotid stenosis measurements (when applicable) are obtained utilizing NASCET criteria, using the distal internal carotid diameter as the denominator. CONTRAST:  50 mL Isovue 370 COMPARISON:  Noncontrast head CT 0935 hours today. FINDINGS: CT BRAIN PERFUSION FINDINGS RAPID post processing of CT perfusion reveals no evidence of cerebral core infarct (designated as CBF less than 30%) or penumbra (designated as Tmax greater than 6 seconds). Cerebral perfusion maps appear essentially symmetric and within  normal limits. CTA NECK Skeleton: No acute osseous abnormality identified. Visualized paranasal sinuses and mastoids are stable and well pneumatized. Upper chest: Negative lung apices. No superior mediastinal lymphadenopathy. Other neck: Negative thyroid, larynx, pharynx, parapharyngeal spaces, retropharyngeal space, sublingual space, submandibular glands and parotid glands. No cervical lymphadenopathy. Aortic arch: 3 vessel arch configuration with no arch or proximal great vessel atherosclerosis or stenosis. Right carotid system: Negative. Left carotid system: Negative. Vertebral arteries:No proximal subclavian artery stenosis. Dominant right vertebral artery. Normal right vertebral artery origin. Normal cervical right vertebral artery. Non dominant left vertebral artery has a normal origin. The left vertebral is somewhat diminutive, but patent and otherwise normal throughout the neck. CTA HEAD Posterior circulation: Dominant distal right vertebral artery is normal. Right PICA origin is normal. Left PICA origin is normal. The left V4 segment is highly diminutive but patent to the vertebrobasilar junction. Normal basilar artery. Normal SCA and right PCA origins. Fetal type left PCA origin. Right posterior  communicating artery also is present. PCA branches are within normal limits. Anterior circulation: Both ICA siphons are patent and normal. Normal ophthalmic and posterior communicating artery origins. Normal carotid termini. Normal MCA and ACA origins. Anterior communicating artery and bilateral ACA branches are normal. Right MCA M1 segment, bifurcation, and right MCA branches are normal. Left MCA M1 segment and bifurcation are normal. Proximal left MCA branches are normal. Questionable mild posterior left in 3 branch irregularity. No left MCA branch occlusion identified. Venous sinuses: Patent. Anatomic variants: Dominant right vertebral artery. Fetal type left PCA origin. Review of the MIP images confirms the  above findings IMPRESSION: 1. Negative for emergent large vessel occlusion. No left MCA branch occlusion identified. 2. Negative CT Perfusion findings. 3. Questionable mild irregularity of distal posterior left MCA branches. Otherwise normal arterial findings on CTA head and neck. 4. Consider PFO or other source of paradoxical emboli in this clinical setting. 5. Preliminary findings of the above discussed by telephone with Dr. Lucia Gaskins on 09/18/2016 at 1011 hours. She wants to pursue limited diffusion MR imaging of the brain, which is pending at this time. Electronically Signed   By: Odessa Fleming M.D.   On: 09/18/2016 10:30   Ct Angio Neck W Or Wo Contrast  Result Date: 09/18/2016 CLINICAL DATA:  25 year old male code stroke. Right side symptoms and abnormal speech. Abnormal posterior left MCA cortex on noncontrast head CT, but appeared more subacute to chronic rather than acute. Initial encounter. EXAM: CT ANGIOGRAPHY HEAD AND NECK CT PERFUSION BRAIN TECHNIQUE: Multidetector CT perfusion omitted imaging of the brain during bolus IV contrast administration. Post processing using the RAPID software system. Multi detector CT Imaging of the head and neck was performed using the standard protocol during bolus administration of intravenous contrast. Multiplanar CT image reconstructions and MIPs were obtained to evaluate the vascular anatomy. Carotid stenosis measurements (when applicable) are obtained utilizing NASCET criteria, using the distal internal carotid diameter as the denominator. CONTRAST:  50 mL Isovue 370 COMPARISON:  Noncontrast head CT 0935 hours today. FINDINGS: CT BRAIN PERFUSION FINDINGS RAPID post processing of CT perfusion reveals no evidence of cerebral core infarct (designated as CBF less than 30%) or penumbra (designated as Tmax greater than 6 seconds). Cerebral perfusion maps appear essentially symmetric and within normal limits. CTA NECK Skeleton: No acute osseous abnormality identified. Visualized  paranasal sinuses and mastoids are stable and well pneumatized. Upper chest: Negative lung apices. No superior mediastinal lymphadenopathy. Other neck: Negative thyroid, larynx, pharynx, parapharyngeal spaces, retropharyngeal space, sublingual space, submandibular glands and parotid glands. No cervical lymphadenopathy. Aortic arch: 3 vessel arch configuration with no arch or proximal great vessel atherosclerosis or stenosis. Right carotid system: Negative. Left carotid system: Negative. Vertebral arteries:No proximal subclavian artery stenosis. Dominant right vertebral artery. Normal right vertebral artery origin. Normal cervical right vertebral artery. Non dominant left vertebral artery has a normal origin. The left vertebral is somewhat diminutive, but patent and otherwise normal throughout the neck. CTA HEAD Posterior circulation: Dominant distal right vertebral artery is normal. Right PICA origin is normal. Left PICA origin is normal. The left V4 segment is highly diminutive but patent to the vertebrobasilar junction. Normal basilar artery. Normal SCA and right PCA origins. Fetal type left PCA origin. Right posterior communicating artery also is present. PCA branches are within normal limits. Anterior circulation: Both ICA siphons are patent and normal. Normal ophthalmic and posterior communicating artery origins. Normal carotid termini. Normal MCA and ACA origins. Anterior communicating artery and bilateral ACA branches are  normal. Right MCA M1 segment, bifurcation, and right MCA branches are normal. Left MCA M1 segment and bifurcation are normal. Proximal left MCA branches are normal. Questionable mild posterior left in 3 branch irregularity. No left MCA branch occlusion identified. Venous sinuses: Patent. Anatomic variants: Dominant right vertebral artery. Fetal type left PCA origin. Review of the MIP images confirms the above findings IMPRESSION: 1. Negative for emergent large vessel occlusion. No left MCA  branch occlusion identified. 2. Negative CT Perfusion findings. 3. Questionable mild irregularity of distal posterior left MCA branches. Otherwise normal arterial findings on CTA head and neck. 4. Consider PFO or other source of paradoxical emboli in this clinical setting. 5. Preliminary findings of the above discussed by telephone with Dr. Lucia Gaskins on 09/18/2016 at 1011 hours. She wants to pursue limited diffusion MR imaging of the brain, which is pending at this time. Electronically Signed   By: Odessa Fleming M.D.   On: 09/18/2016 10:30   Mr Brain Wo Contrast  Result Date: 09/18/2016 CLINICAL DATA:  25 year old male code stroke presentation today with right side symptoms. Subacute to chronic appearing posterior left MCA ischemia on noncontrast CT, with largely negative CTA/CTP findings. Initial encounter. EXAM: MRI HEAD WITHOUT CONTRAST TECHNIQUE: Multiplanar, multiecho pulse sequences of the brain and surrounding structures were obtained without intravenous contrast. COMPARISON:  CTA, CT perfusion and CT head from today. FINDINGS: Brain: No restricted diffusion or evidence of acute infarction. Mild encephalomalacia with Facilitated cortical diffusion along the posterior left sylvian fissure corresponding to the cortical abnormality on CT today. Elsewhere normal gray and white matter signal. No chronic cerebral blood products identified. No midline shift, mass effect, evidence of mass lesion, ventriculomegaly, extra-axial collection or acute intracranial hemorrhage. Cervicomedullary junction and pituitary are within normal limits. Vascular: Major intracranial vascular flow voids are preserved. Skull and upper cervical spine: Negative. Normal bone marrow signal. Sinuses/Orbits: Negative orbits. Visualized paranasal sinuses and mastoids are stable and well pneumatized. Other: Negative scalp soft tissues. Visible internal auditory structures appear normal. IMPRESSION: 1. Negative for acute infarct or acute intracranial  abnormality. 2. Small area of chronic ischemia in the posterior left MCA territory, corresponding to the CT findings today. 3. Preliminary report of the above discussed by telephone with Dr. Lucia Gaskins on 09/18/2016 At 1045 hours. Electronically Signed   By: Odessa Fleming M.D.   On: 09/18/2016 11:07   Ct Cerebral Perfusion W Contrast  Result Date: 09/18/2016 CLINICAL DATA:  25 year old male code stroke. Right side symptoms and abnormal speech. Abnormal posterior left MCA cortex on noncontrast head CT, but appeared more subacute to chronic rather than acute. Initial encounter. EXAM: CT ANGIOGRAPHY HEAD AND NECK CT PERFUSION BRAIN TECHNIQUE: Multidetector CT perfusion omitted imaging of the brain during bolus IV contrast administration. Post processing using the RAPID software system. Multi detector CT Imaging of the head and neck was performed using the standard protocol during bolus administration of intravenous contrast. Multiplanar CT image reconstructions and MIPs were obtained to evaluate the vascular anatomy. Carotid stenosis measurements (when applicable) are obtained utilizing NASCET criteria, using the distal internal carotid diameter as the denominator. CONTRAST:  50 mL Isovue 370 COMPARISON:  Noncontrast head CT 0935 hours today. FINDINGS: CT BRAIN PERFUSION FINDINGS RAPID post processing of CT perfusion reveals no evidence of cerebral core infarct (designated as CBF less than 30%) or penumbra (designated as Tmax greater than 6 seconds). Cerebral perfusion maps appear essentially symmetric and within normal limits. CTA NECK Skeleton: No acute osseous abnormality identified. Visualized paranasal sinuses and  mastoids are stable and well pneumatized. Upper chest: Negative lung apices. No superior mediastinal lymphadenopathy. Other neck: Negative thyroid, larynx, pharynx, parapharyngeal spaces, retropharyngeal space, sublingual space, submandibular glands and parotid glands. No cervical lymphadenopathy. Aortic arch:  3 vessel arch configuration with no arch or proximal great vessel atherosclerosis or stenosis. Right carotid system: Negative. Left carotid system: Negative. Vertebral arteries:No proximal subclavian artery stenosis. Dominant right vertebral artery. Normal right vertebral artery origin. Normal cervical right vertebral artery. Non dominant left vertebral artery has a normal origin. The left vertebral is somewhat diminutive, but patent and otherwise normal throughout the neck. CTA HEAD Posterior circulation: Dominant distal right vertebral artery is normal. Right PICA origin is normal. Left PICA origin is normal. The left V4 segment is highly diminutive but patent to the vertebrobasilar junction. Normal basilar artery. Normal SCA and right PCA origins. Fetal type left PCA origin. Right posterior communicating artery also is present. PCA branches are within normal limits. Anterior circulation: Both ICA siphons are patent and normal. Normal ophthalmic and posterior communicating artery origins. Normal carotid termini. Normal MCA and ACA origins. Anterior communicating artery and bilateral ACA branches are normal. Right MCA M1 segment, bifurcation, and right MCA branches are normal. Left MCA M1 segment and bifurcation are normal. Proximal left MCA branches are normal. Questionable mild posterior left in 3 branch irregularity. No left MCA branch occlusion identified. Venous sinuses: Patent. Anatomic variants: Dominant right vertebral artery. Fetal type left PCA origin. Review of the MIP images confirms the above findings IMPRESSION: 1. Negative for emergent large vessel occlusion. No left MCA branch occlusion identified. 2. Negative CT Perfusion findings. 3. Questionable mild irregularity of distal posterior left MCA branches. Otherwise normal arterial findings on CTA head and neck. 4. Consider PFO or other source of paradoxical emboli in this clinical setting. 5. Preliminary findings of the above discussed by telephone  with Dr. Lucia Gaskins on 09/18/2016 at 1011 hours. She wants to pursue limited diffusion MR imaging of the brain, which is pending at this time. Electronically Signed   By: Odessa Fleming M.D.   On: 09/18/2016 10:30   Ct Head Code Stroke W/o Cm  Result Date: 09/18/2016 CLINICAL DATA:  Code stroke. 25 year old male with right side weakness and difficulty speaking. Initial encounter. EXAM: CT HEAD WITHOUT CONTRAST TECHNIQUE: Contiguous axial images were obtained from the base of the skull through the vertex without intravenous contrast. COMPARISON:  None. FINDINGS: Brain: Positive for abnormal cortex and subcortical white matter along the posterior left sylvian fissure and the superior lateral left peri-Rolandic cortex. See series 201, images 16- 22. However, at least some of this appears subacute to chronic. No associated mass effect. Gray-white matter differentiation elsewhere is within normal limits. No acute intracranial hemorrhage identified. No ventriculomegaly. Vascular: Anterior circulation vasculature appears symmetric without suspicious intracranial vascular hyperdensity. Skull: Visualized osseous structures are within normal limits. Sinuses/Orbits: Clear. Other: Negative orbit and scalp soft tissues. ASPECTS ([place] Stroke Program Early CT Score) The following is assuming all of the posterior left MCA territory abnormal cortex is acute rather than subacute to chronic. - Ganglionic level infarction (caudate, lentiform nuclei, internal capsule, insula, M1-M3 cortex): 7 - Supraganglionic infarction (M4-M6 cortex): 1 Total score (0-10 with 10 being normal): 8 IMPRESSION: 1. Evidence of cortically based infarct in the posterior left MCA territory, but appears more subacute to chronic than acute. No associated hemorrhage or mass effect. No other changes of acute cortically based infarct. 2. ASPECTS is 8, BUT this is assuming that all of the  findings in #1 are acute. 3. Study discussed by telephone with Dr. Lucia Gaskins on  09/18/2016 at 09:51 . Electronically Signed   By: Odessa Fleming M.D.   On: 09/18/2016 09:52        Scheduled Meds: . aspirin  300 mg Rectal Daily   Or  . aspirin  325 mg Oral Daily  . clopidogrel  75 mg Oral Daily  . famotidine  40 mg Oral Daily  . heparin  5,000 Units Subcutaneous Q8H  . sodium chloride flush  3 mL Intravenous Q12H  . sodium chloride flush  3 mL Intravenous Q12H   Continuous Infusions: . sodium chloride 100 mL/hr at 09/19/16 0636     LOS: 0 days     Jacquelin Hawking [facility] 09/19/2016, 1:44 PM Pager: [phone]  If 7PM-7AM, please contact night-coverage [email or web address] Password [email or web address] 09/19/2016, 1:44 PM

## 2016-09-19 NOTE — Progress Notes (Signed)
STROKE TEAM PROGRESS NOTE   HISTORY OF PRESENT ILLNESS (per record) Ryan Howard is an 25 y.o. male who is brought to [facility] department as a code stroke. Patient was last seen normal at 9 AM 09/18/2016 ([facility]) while he was in class when suddenly he felt that his right arm became suddenly weak and he could not move it along with his speech became altered. On arrival patient had mumbled speech and was not moving his right arm or leg. By the time he arrived at CT scanner which is approximately 10 minutes later patient was moving his right arm and right leg antigravity. Patient continued to have mumbled speech. Patient was able to follow all commands and was trying to express himself however due to the month mumbled speech she was intelligible. He did state that he was recently seen in [place] July 2016 and had a stroke. Although they are in Epic if we were unable to pull his records but were able to get in contact to the hospital who faxed his records. During exam it was noted patient had multiple functional qualities along with inconsistent effort.  Patient was seen at [facility] on 06/2015.  At time of arrival to the hospital for code stroke patient had a CTA of brain and neck that showed significant calcification along the falx but no acute abnormalities. His NIH stroke scale was 7--2 for right facial palsy, 1 for right arm drift, 1 for right leg drift, 1 for sensory loss on the right, 1 for dysarthria, and 1 for aphasia. Patient underwent an MRI brain which showed an acute infarct in the left inferior parietal lobe with involvement of left postcentral gyrus.  Patient did receive TPA at that time and no complications. At that time he had an echocardiogram that was performed and revealed moderate global hypokinesis of the left ventricle with moderately reduced left ventricular systolic function and an EF of only 25%. Patient was placed on Coreg. At time of discharge patient had only  dysarthria and mild right upper extremity weakness. Patient was discharged on aspirin and Plavix.  Due to patient's inconsistency on exam patient underwent a CTA of head and neck along with perfusion scan. These results were normal. Patient then was brought MRI for a limited diffusion scan. Diffusion scan showed no stroke.  Patient was not administered IV t-PA secondary to Improving symptoms and minimal NIH. He was admitted for further evaluation and treatment.   SUBJECTIVE (INTERVAL HISTORY) No friends/family are at the bedside.  Overall he feels his condition is stable. He has admitted to anxiety and depression. He told code stroke response nurse yesterday that if he went home from school, he would be a failure.    OBJECTIVE Temp:  [97.4 F (36.3 C)-99.4 F (37.4 C)] 97.8 F (36.6 C) (10/18 0931) Pulse Rate:  [54-85] 54 (10/18 0931) Cardiac Rhythm: Sinus bradycardia (10/18 0702) Resp:  [14-23] 18 (10/18 0931) BP: (129-170)/(67-107) 129/74 (10/18 0931) SpO2:  [95 %-100 %] 96 % (10/18 0931) Weight:  [93.8 kg (206 lb 14.4 oz)-94.4 kg (208 lb 1.8 oz)] 93.8 kg (206 lb 14.4 oz) (10/17 1820)  CBC:  Recent Labs Lab 09/18/16 0925 09/18/16 0944 09/19/16 0002  WBC 7.2  --  5.9  NEUTROABS 2.9  --   --   HGB 14.7 15.3 13.2  HCT 42.1 45.0 38.2*  MCV 90.9  --  89.7  PLT 168  --  179    Basic Metabolic Panel:  Recent Labs Lab  09/18/16 0925 09/18/16 0944 09/19/16 0002  NA 141 143 141  K 3.5 3.4* 3.4*  CL 107 103 107  CO2 26  --  27  GLUCOSE 85 81 89  BUN 9 10 7   CREATININE 1.34* 1.30* 1.17  CALCIUM 9.7  --  9.0    Lipid Panel:    Component Value Date/Time   CHOL 185 09/19/2016 0002   TRIG 94 09/19/2016 0002   HDL 39 (L) 09/19/2016 0002   CHOLHDL 4.7 09/19/2016 0002   VLDL 19 09/19/2016 0002   LDLCALC 127 (H) 09/19/2016 0002   HgbA1c: No results found for: HGBA1C Urine Drug Screen:    Component Value Date/Time   LABOPIA NONE DETECTED 09/18/2016 1317   COCAINSCRNUR  NONE DETECTED 09/18/2016 1317   LABBENZ NONE DETECTED 09/18/2016 1317   AMPHETMU NONE DETECTED 09/18/2016 1317   THCU NONE DETECTED 09/18/2016 1317   LABBARB NONE DETECTED 09/18/2016 1317      IMAGING  Ct Angio Head W Or Wo Contrast  Result Date: 09/18/2016 CLINICAL DATA:  25 year old male code stroke. Right side symptoms and abnormal speech. Abnormal posterior left MCA cortex on noncontrast head CT, but appeared more subacute to chronic rather than acute. Initial encounter. EXAM: CT ANGIOGRAPHY HEAD AND NECK CT PERFUSION BRAIN TECHNIQUE: Multidetector CT perfusion omitted imaging of the brain during bolus IV contrast administration. Post processing using the RAPID software system. Multi detector CT Imaging of the head and neck was performed using the standard protocol during bolus administration of intravenous contrast. Multiplanar CT image reconstructions and MIPs were obtained to evaluate the vascular anatomy. Carotid stenosis measurements (when applicable) are obtained utilizing NASCET criteria, using the distal internal carotid diameter as the denominator. CONTRAST:  50 mL Isovue 370 COMPARISON:  Noncontrast head CT 0935 hours today. FINDINGS: CT BRAIN PERFUSION FINDINGS RAPID post processing of CT perfusion reveals no evidence of cerebral core infarct (designated as CBF less than 30%) or penumbra (designated as Tmax greater than 6 seconds). Cerebral perfusion maps appear essentially symmetric and within normal limits. CTA NECK Skeleton: No acute osseous abnormality identified. Visualized paranasal sinuses and mastoids are stable and well pneumatized. Upper chest: Negative lung apices. No superior mediastinal lymphadenopathy. Other neck: Negative thyroid, larynx, pharynx, parapharyngeal spaces, retropharyngeal space, sublingual space, submandibular glands and parotid glands. No cervical lymphadenopathy. Aortic arch: 3 vessel arch configuration with no arch or proximal great vessel atherosclerosis  or stenosis. Right carotid system: Negative. Left carotid system: Negative. Vertebral arteries:No proximal subclavian artery stenosis. Dominant right vertebral artery. Normal right vertebral artery origin. Normal cervical right vertebral artery. Non dominant left vertebral artery has a normal origin. The left vertebral is somewhat diminutive, but patent and otherwise normal throughout the neck. CTA HEAD Posterior circulation: Dominant distal right vertebral artery is normal. Right PICA origin is normal. Left PICA origin is normal. The left V4 segment is highly diminutive but patent to the vertebrobasilar junction. Normal basilar artery. Normal SCA and right PCA origins. Fetal type left PCA origin. Right posterior communicating artery also is present. PCA branches are within normal limits. Anterior circulation: Both ICA siphons are patent and normal. Normal ophthalmic and posterior communicating artery origins. Normal carotid termini. Normal MCA and ACA origins. Anterior communicating artery and bilateral ACA branches are normal. Right MCA M1 segment, bifurcation, and right MCA branches are normal. Left MCA M1 segment and bifurcation are normal. Proximal left MCA branches are normal. Questionable mild posterior left in 3 branch irregularity. No left MCA branch occlusion identified. Venous sinuses:  Patent. Anatomic variants: Dominant right vertebral artery. Fetal type left PCA origin. Review of the MIP images confirms the above findings IMPRESSION: 1. Negative for emergent large vessel occlusion. No left MCA branch occlusion identified. 2. Negative CT Perfusion findings. 3. Questionable mild irregularity of distal posterior left MCA branches. Otherwise normal arterial findings on CTA head and neck. 4. Consider PFO or other source of paradoxical emboli in this clinical setting. 5. Preliminary findings of the above discussed by telephone with Dr. Lucia Gaskins on 09/18/2016 at 1011 hours. She wants to pursue limited diffusion  MR imaging of the brain, which is pending at this time. Electronically Signed   By: Odessa Fleming M.D.   On: 09/18/2016 10:30   Ct Angio Neck W Or Wo Contrast  Result Date: 09/18/2016 CLINICAL DATA:  25 year old male code stroke. Right side symptoms and abnormal speech. Abnormal posterior left MCA cortex on noncontrast head CT, but appeared more subacute to chronic rather than acute. Initial encounter. EXAM: CT ANGIOGRAPHY HEAD AND NECK CT PERFUSION BRAIN TECHNIQUE: Multidetector CT perfusion omitted imaging of the brain during bolus IV contrast administration. Post processing using the RAPID software system. Multi detector CT Imaging of the head and neck was performed using the standard protocol during bolus administration of intravenous contrast. Multiplanar CT image reconstructions and MIPs were obtained to evaluate the vascular anatomy. Carotid stenosis measurements (when applicable) are obtained utilizing NASCET criteria, using the distal internal carotid diameter as the denominator. CONTRAST:  50 mL Isovue 370 COMPARISON:  Noncontrast head CT 0935 hours today. FINDINGS: CT BRAIN PERFUSION FINDINGS RAPID post processing of CT perfusion reveals no evidence of cerebral core infarct (designated as CBF less than 30%) or penumbra (designated as Tmax greater than 6 seconds). Cerebral perfusion maps appear essentially symmetric and within normal limits. CTA NECK Skeleton: No acute osseous abnormality identified. Visualized paranasal sinuses and mastoids are stable and well pneumatized. Upper chest: Negative lung apices. No superior mediastinal lymphadenopathy. Other neck: Negative thyroid, larynx, pharynx, parapharyngeal spaces, retropharyngeal space, sublingual space, submandibular glands and parotid glands. No cervical lymphadenopathy. Aortic arch: 3 vessel arch configuration with no arch or proximal great vessel atherosclerosis or stenosis. Right carotid system: Negative. Left carotid system: Negative. Vertebral  arteries:No proximal subclavian artery stenosis. Dominant right vertebral artery. Normal right vertebral artery origin. Normal cervical right vertebral artery. Non dominant left vertebral artery has a normal origin. The left vertebral is somewhat diminutive, but patent and otherwise normal throughout the neck. CTA HEAD Posterior circulation: Dominant distal right vertebral artery is normal. Right PICA origin is normal. Left PICA origin is normal. The left V4 segment is highly diminutive but patent to the vertebrobasilar junction. Normal basilar artery. Normal SCA and right PCA origins. Fetal type left PCA origin. Right posterior communicating artery also is present. PCA branches are within normal limits. Anterior circulation: Both ICA siphons are patent and normal. Normal ophthalmic and posterior communicating artery origins. Normal carotid termini. Normal MCA and ACA origins. Anterior communicating artery and bilateral ACA branches are normal. Right MCA M1 segment, bifurcation, and right MCA branches are normal. Left MCA M1 segment and bifurcation are normal. Proximal left MCA branches are normal. Questionable mild posterior left in 3 branch irregularity. No left MCA branch occlusion identified. Venous sinuses: Patent. Anatomic variants: Dominant right vertebral artery. Fetal type left PCA origin. Review of the MIP images confirms the above findings IMPRESSION: 1. Negative for emergent large vessel occlusion. No left MCA branch occlusion identified. 2. Negative CT Perfusion findings. 3. Questionable mild  irregularity of distal posterior left MCA branches. Otherwise normal arterial findings on CTA head and neck. 4. Consider PFO or other source of paradoxical emboli in this clinical setting. 5. Preliminary findings of the above discussed by telephone with Dr. Lucia GaskinsAhern on 09/18/2016 at 1011 hours. She wants to pursue limited diffusion MR imaging of the brain, which is pending at this time. Electronically Signed   By: Odessa FlemingH   Hall M.D.   On: 09/18/2016 10:30   Mr Brain Wo Contrast  Result Date: 09/18/2016 CLINICAL DATA:  25 year old male code stroke presentation today with right side symptoms. Subacute to chronic appearing posterior left MCA ischemia on noncontrast CT, with largely negative CTA/CTP findings. Initial encounter. EXAM: MRI HEAD WITHOUT CONTRAST TECHNIQUE: Multiplanar, multiecho pulse sequences of the brain and surrounding structures were obtained without intravenous contrast. COMPARISON:  CTA, CT perfusion and CT head from today. FINDINGS: Brain: No restricted diffusion or evidence of acute infarction. Mild encephalomalacia with Facilitated cortical diffusion along the posterior left sylvian fissure corresponding to the cortical abnormality on CT today. Elsewhere normal gray and white matter signal. No chronic cerebral blood products identified. No midline shift, mass effect, evidence of mass lesion, ventriculomegaly, extra-axial collection or acute intracranial hemorrhage. Cervicomedullary junction and pituitary are within normal limits. Vascular: Major intracranial vascular flow voids are preserved. Skull and upper cervical spine: Negative. Normal bone marrow signal. Sinuses/Orbits: Negative orbits. Visualized paranasal sinuses and mastoids are stable and well pneumatized. Other: Negative scalp soft tissues. Visible internal auditory structures appear normal. IMPRESSION: 1. Negative for acute infarct or acute intracranial abnormality. 2. Small area of chronic ischemia in the posterior left MCA territory, corresponding to the CT findings today. 3. Preliminary report of the above discussed by telephone with Dr. Lucia GaskinsAhern on 09/18/2016 At 1045 hours. Electronically Signed   By: Odessa FlemingH  Hall M.D.   On: 09/18/2016 11:07   Ct Cerebral Perfusion W Contrast  Result Date: 09/18/2016 CLINICAL DATA:  25 year old male code stroke. Right side symptoms and abnormal speech. Abnormal posterior left MCA cortex on noncontrast head CT, but  appeared more subacute to chronic rather than acute. Initial encounter. EXAM: CT ANGIOGRAPHY HEAD AND NECK CT PERFUSION BRAIN TECHNIQUE: Multidetector CT perfusion omitted imaging of the brain during bolus IV contrast administration. Post processing using the RAPID software system. Multi detector CT Imaging of the head and neck was performed using the standard protocol during bolus administration of intravenous contrast. Multiplanar CT image reconstructions and MIPs were obtained to evaluate the vascular anatomy. Carotid stenosis measurements (when applicable) are obtained utilizing NASCET criteria, using the distal internal carotid diameter as the denominator. CONTRAST:  50 mL Isovue 370 COMPARISON:  Noncontrast head CT 0935 hours today. FINDINGS: CT BRAIN PERFUSION FINDINGS RAPID post processing of CT perfusion reveals no evidence of cerebral core infarct (designated as CBF less than 30%) or penumbra (designated as Tmax greater than 6 seconds). Cerebral perfusion maps appear essentially symmetric and within normal limits. CTA NECK Skeleton: No acute osseous abnormality identified. Visualized paranasal sinuses and mastoids are stable and well pneumatized. Upper chest: Negative lung apices. No superior mediastinal lymphadenopathy. Other neck: Negative thyroid, larynx, pharynx, parapharyngeal spaces, retropharyngeal space, sublingual space, submandibular glands and parotid glands. No cervical lymphadenopathy. Aortic arch: 3 vessel arch configuration with no arch or proximal great vessel atherosclerosis or stenosis. Right carotid system: Negative. Left carotid system: Negative. Vertebral arteries:No proximal subclavian artery stenosis. Dominant right vertebral artery. Normal right vertebral artery origin. Normal cervical right vertebral artery. Non dominant left vertebral artery has  a normal origin. The left vertebral is somewhat diminutive, but patent and otherwise normal throughout the neck. CTA HEAD Posterior  circulation: Dominant distal right vertebral artery is normal. Right PICA origin is normal. Left PICA origin is normal. The left V4 segment is highly diminutive but patent to the vertebrobasilar junction. Normal basilar artery. Normal SCA and right PCA origins. Fetal type left PCA origin. Right posterior communicating artery also is present. PCA branches are within normal limits. Anterior circulation: Both ICA siphons are patent and normal. Normal ophthalmic and posterior communicating artery origins. Normal carotid termini. Normal MCA and ACA origins. Anterior communicating artery and bilateral ACA branches are normal. Right MCA M1 segment, bifurcation, and right MCA branches are normal. Left MCA M1 segment and bifurcation are normal. Proximal left MCA branches are normal. Questionable mild posterior left in 3 branch irregularity. No left MCA branch occlusion identified. Venous sinuses: Patent. Anatomic variants: Dominant right vertebral artery. Fetal type left PCA origin. Review of the MIP images confirms the above findings IMPRESSION: 1. Negative for emergent large vessel occlusion. No left MCA branch occlusion identified. 2. Negative CT Perfusion findings. 3. Questionable mild irregularity of distal posterior left MCA branches. Otherwise normal arterial findings on CTA head and neck. 4. Consider PFO or other source of paradoxical emboli in this clinical setting. 5. Preliminary findings of the above discussed by telephone with Dr. Lucia Gaskins on 09/18/2016 at 1011 hours. She wants to pursue limited diffusion MR imaging of the brain, which is pending at this time. Electronically Signed   By: Odessa Fleming M.D.   On: 09/18/2016 10:30   Ct Head Code Stroke W/o Cm  Result Date: 09/18/2016 CLINICAL DATA:  Code stroke. 25 year old male with right side weakness and difficulty speaking. Initial encounter. EXAM: CT HEAD WITHOUT CONTRAST TECHNIQUE: Contiguous axial images were obtained from the base of the skull through the  vertex without intravenous contrast. COMPARISON:  None. FINDINGS: Brain: Positive for abnormal cortex and subcortical white matter along the posterior left sylvian fissure and the superior lateral left peri-Rolandic cortex. See series 201, images 16- 22. However, at least some of this appears subacute to chronic. No associated mass effect. Gray-white matter differentiation elsewhere is within normal limits. No acute intracranial hemorrhage identified. No ventriculomegaly. Vascular: Anterior circulation vasculature appears symmetric without suspicious intracranial vascular hyperdensity. Skull: Visualized osseous structures are within normal limits. Sinuses/Orbits: Clear. Other: Negative orbit and scalp soft tissues. ASPECTS ([place] Stroke Program Early CT Score) The following is assuming all of the posterior left MCA territory abnormal cortex is acute rather than subacute to chronic. - Ganglionic level infarction (caudate, lentiform nuclei, internal capsule, insula, M1-M3 cortex): 7 - Supraganglionic infarction (M4-M6 cortex): 1 Total score (0-10 with 10 being normal): 8 IMPRESSION: 1. Evidence of cortically based infarct in the posterior left MCA territory, but appears more subacute to chronic than acute. No associated hemorrhage or mass effect. No other changes of acute cortically based infarct. 2. ASPECTS is 8, BUT this is assuming that all of the findings in #1 are acute. 3. Study discussed by telephone with Dr. Lucia Gaskins on 09/18/2016 at 09:51 . Electronically Signed   By: Odessa Fleming M.D.   On: 09/18/2016 09:52       PHYSICAL EXAM Pleasant young african [place] male not in distress. . Afebrile. Head is nontraumatic. Neck is supple without bruit.    Cardiac exam no murmur or gallop. Lungs are clear to auscultation. Distal pulses are well felt. Neurological Exam ;  Awake  Alert oriented  x 3. Normal speech and language.eye movements full without nystagmus.fundi were not visualized. Vision acuity and fields  appear normal. Hearing is normal. Palatal movements are normal. Face symmetric. Tongue midline. Normal strength, tone, reflexes and coordination with only slight give away weakness in right hand. Mild subjective right cheek paresthesias.. Normal sensation. Gait deferred.  ASSESSMENT/PLAN Ryan Howard is a 25 y.o. male with history of stroke in 2016, morbid obesity s/p gastric sleeve procedure presenting with R sided weakness. He did not receive IV t-PA due to improving symptoms and mild NIHSS.   Possible reactivation of old deficits versus conversion reaction  MRI  No acute infarct  CTA head and neck no large vessel occlusion  CT perfusion no penumbra  2D Echo  pending   LDL 127  HgbA1c pending  Heparin 5000 units sq tid for VTE prophylaxis  Diet Heart Room service appropriate? Yes; Fluid consistency: Thin  Diet regular Room service appropriate? Yes; Fluid consistency: Thin  No antithrombotic prior to admission, now on aspirin 325 mg daily. Given prior stroke, patient should be discharged on asa daily for secondary stroke prevention. No indication to change/add antiplatelets at this time  Patient counseled to be compliant with his antithrombotic medications  Ongoing aggressive stroke risk factor management  Therapy recommendations:  pending   Disposition:  pending   While stroke workup is reason al, patient with obvious underlying mental/pscyh issues. =  Hyperlipidemia  Home meds:  No statin  LDL 127, goal < 70  Add statin  Other Stroke Risk Factors  Hx morbid Obesity s/p gastric sleeve bariatric surger, now Body mass index is 26.56 kg/m.  Hx stroke/TIA  July/2016 - L  MCA parietal and postcentral gyrus infarct, EF 25%, started on coreg, asa and plavix.  resultant dysarthria, RUE weakness, field cut, speech   Hx cardiomyopathy, EF 25% in the past  Other Active Problems  Chest pain   Renal insufficiency  Hospital day # 0  [facility] See Amion for Pager information 09/19/2016 10:38 AM  I have personally examined this patient, reviewed notes, independently viewed imaging studies, participated in medical decision making and plan of care.ROS completed by me personally and pertinent positives fully documented  I have made any additions or clarifications directly to the above note. Agree with note above. Patient presented with strokelike symptoms but has normal MRI scan and clear underlying psychosocial stressors. Recommend psych consult to address his underlying stressors and depression. Continue ongoing stroke evaluation given the fact that he had a previous stroke. And half ago. Recommend coated 81 mg aspirin for secondary stroke prevention. Greater than 50% time during this 35 minute   visit was spent on counseling and coordination of care about his stroke and TIA risk, prevention and treatment  Delia Heady, MD Medical Director [facility] Pager: [phone] 09/19/2016 1:31 PM  To contact Stroke Continuity provider, please refer to [email or web address]. After hours, contact General Neurology

## 2016-09-20 ENCOUNTER — Observation Stay (HOSPITAL_COMMUNITY): Payer: 59

## 2016-09-20 ENCOUNTER — Encounter (HOSPITAL_COMMUNITY): Payer: Self-pay | Admitting: General Practice

## 2016-09-20 ENCOUNTER — Other Ambulatory Visit (HOSPITAL_COMMUNITY): Payer: 59

## 2016-09-20 DIAGNOSIS — R079 Chest pain, unspecified: Secondary | ICD-10-CM | POA: Diagnosis not present

## 2016-09-20 DIAGNOSIS — R531 Weakness: Secondary | ICD-10-CM | POA: Diagnosis not present

## 2016-09-20 DIAGNOSIS — R200 Anesthesia of skin: Secondary | ICD-10-CM | POA: Diagnosis not present

## 2016-09-20 DIAGNOSIS — R931 Abnormal findings on diagnostic imaging of heart and coronary circulation: Secondary | ICD-10-CM

## 2016-09-20 DIAGNOSIS — R471 Dysarthria and anarthria: Secondary | ICD-10-CM | POA: Diagnosis not present

## 2016-09-20 DIAGNOSIS — R2990 Unspecified symptoms and signs involving the nervous system: Secondary | ICD-10-CM | POA: Diagnosis not present

## 2016-09-20 LAB — PROTEIN S ACTIVITY: PROTEIN S ACTIVITY: 126 % (ref 63–140)

## 2016-09-20 LAB — ECHOCARDIOGRAM COMPLETE
Height: 74 in
Weight: 3310.4 oz

## 2016-09-20 LAB — LUPUS ANTICOAGULANT PANEL
DRVVT: 40.8 s (ref 0.0–47.0)
PTT Lupus Anticoagulant: 32.3 s (ref 0.0–51.9)

## 2016-09-20 LAB — HEMOGLOBIN A1C
Hgb A1c MFr Bld: 5.3 % (ref 4.8–5.6)
Mean Plasma Glucose: 105 mg/dL

## 2016-09-20 LAB — PROTEIN C ACTIVITY: PROTEIN C ACTIVITY: 122 % (ref 73–180)

## 2016-09-20 LAB — PROTEIN S, TOTAL: Protein S Ag, Total: 93 % (ref 60–150)

## 2016-09-20 MED ORDER — ASPIRIN EC 81 MG PO TBEC: 81.0000 mg | DELAYED_RELEASE_TABLET | Freq: Every day | ORAL | 0 refills | Status: AC

## 2016-09-20 NOTE — Progress Notes (Addendum)
Pt c/o chest pain described as pressure 5/10. Doctor Craige Cotta paged at [phone]. Rapid response nurse called, unable to come due to participation in code blue, VS and EKG completed. MD notified about results. GI cocktail given per doctor order. Will continue to monitor pt.

## 2016-09-20 NOTE — Progress Notes (Signed)
STROKE TEAM PROGRESS NOTE   HISTORY OF PRESENT ILLNESS (per record) Ryan Howard is an 25 y.o. male who is brought to [facility] department as a code stroke. Patient was last seen normal at 9 AM 09/18/2016 ([facility]) while he was in class when suddenly he felt that his right arm became suddenly weak and he could not move it along with his speech became altered. On arrival patient had mumbled speech and was not moving his right arm or leg. By the time he arrived at CT scanner which is approximately 10 minutes later patient was moving his right arm and right leg antigravity. Patient continued to have mumbled speech. Patient was able to follow all commands and was trying to express himself however due to the month mumbled speech she was intelligible. He did state that he was recently seen in [place] July 2016 and had a stroke. Although they are in Epic if we were unable to pull his records but were able to get in contact to the hospital who faxed his records. During exam it was noted patient had multiple functional qualities along with inconsistent effort.  Patient was seen at [facility] on 06/2015.  At time of arrival to the hospital for code stroke patient had a CTA of brain and neck that showed significant calcification along the falx but no acute abnormalities. His NIH stroke scale was 7--2 for right facial palsy, 1 for right arm drift, 1 for right leg drift, 1 for sensory loss on the right, 1 for dysarthria, and 1 for aphasia. Patient underwent an MRI brain which showed an acute infarct in the left inferior parietal lobe with involvement of left postcentral gyrus.  Patient did receive TPA at that time and no complications. At that time he had an echocardiogram that was performed and revealed moderate global hypokinesis of the left ventricle with moderately reduced left ventricular systolic function and an EF of only 25%. Patient was placed on Coreg. At time of discharge patient had only  dysarthria and mild right upper extremity weakness. Patient was discharged on aspirin and Plavix.  Due to patient's inconsistency on exam patient underwent a CTA of head and neck along with perfusion scan. These results were normal. Patient then was brought MRI for a limited diffusion scan. Diffusion scan showed no stroke.  Patient was not administered IV t-PA secondary to Improving symptoms and minimal NIH. He was admitted for further evaluation and treatment.   SUBJECTIVE (INTERVAL HISTORY) No friends/family are at the bedside.  Overall he feels his condition is stable. He is awaiting echo and psychiatry consult   OBJECTIVE Temp:  [98.2 F (36.8 C)-98.5 F (36.9 C)] 98.4 F (36.9 C) (10/19 1203) Pulse Rate:  [49-59] 51 (10/19 1203) Cardiac Rhythm: Sinus bradycardia (10/19 0702) Resp:  [16-18] 16 (10/19 1203) BP: (144-148)/(79-110) 148/79 (10/19 1203) SpO2:  [97 %-100 %] 100 % (10/19 1203)  CBC:   Recent Labs Lab 09/18/16 0925 09/18/16 0944 09/19/16 0002  WBC 7.2  --  5.9  NEUTROABS 2.9  --   --   HGB 14.7 15.3 13.2  HCT 42.1 45.0 38.2*  MCV 90.9  --  89.7  PLT 168  --  179    Basic Metabolic Panel:   Recent Labs Lab 09/18/16 0925 09/18/16 0944 09/19/16 0002  NA 141 143 141  K 3.5 3.4* 3.4*  CL 107 103 107  CO2 26  --  27  GLUCOSE 85 81 89  BUN 9 10 7  CREATININE 1.34* 1.30* 1.17  CALCIUM 9.7  --  9.0    Lipid Panel:     Component Value Date/Time   CHOL 185 09/19/2016 0002   TRIG 94 09/19/2016 0002   HDL 39 (L) 09/19/2016 0002   CHOLHDL 4.7 09/19/2016 0002   VLDL 19 09/19/2016 0002   LDLCALC 127 (H) 09/19/2016 0002   HgbA1c:  Lab Results  Component Value Date   HGBA1C 5.3 09/19/2016   Urine Drug Screen:     Component Value Date/Time   LABOPIA NONE DETECTED 09/18/2016 1317   COCAINSCRNUR NONE DETECTED 09/18/2016 1317   LABBENZ NONE DETECTED 09/18/2016 1317   AMPHETMU NONE DETECTED 09/18/2016 1317   THCU NONE DETECTED 09/18/2016 1317    LABBARB NONE DETECTED 09/18/2016 1317      IMAGING  No results found.     PHYSICAL EXAM Pleasant young african [place] male not in distress. . Afebrile. Head is nontraumatic. Neck is supple without bruit.    Cardiac exam no murmur or gallop. Lungs are clear to auscultation. Distal pulses are well felt. Neurological Exam ;  Awake  Alert oriented x 3. Normal speech and language.eye movements full without nystagmus.fundi were not visualized. Vision acuity and fields appear normal. Hearing is normal. Palatal movements are normal. Face symmetric. Tongue midline. Normal strength, tone, reflexes and coordination with only slight give away weakness in right hand. Mild subjective right cheek paresthesias.. Normal sensation. Gait deferred.  ASSESSMENT/PLAN Mr. Ryan Howard is a 25 y.o. male with history of stroke in 2016, morbid obesity s/p gastric sleeve procedure presenting with R sided weakness. He did not receive IV t-PA due to improving symptoms and mild NIHSS.   Possible reactivation of old deficits versus conversion reaction  MRI  No acute infarct  CTA head and neck no large vessel occlusion  CT perfusion no penumbra  2D Echo  pending   LDL 127  HgbA1c 5.3  Heparin 5000 units sq tid for VTE prophylaxis Diet Heart Room service appropriate? Yes; Fluid consistency: Thin Diet regular Room service appropriate? Yes; Fluid consistency: Thin  No antithrombotic prior to admission, now on aspirin 325 mg daily. Given prior stroke, patient should be discharged on asa daily for secondary stroke prevention. No indication to change/add antiplatelets at this time  Patient counseled to be compliant with his antithrombotic medications  Ongoing aggressive stroke risk factor management  Therapy recommendations:  pending   Disposition:  pending   While stroke workup is reason al, patient with obvious underlying mental/pscyh issues. =  Hyperlipidemia  Home meds:  No statin  LDL 127,  goal < 70  Add statin  Other Stroke Risk Factors  Hx morbid Obesity s/p gastric sleeve bariatric surger, now Body mass index is 26.56 kg/m.  Hx stroke/TIA  July/2016 - L  MCA parietal and postcentral gyrus infarct, EF 25%, started on coreg, asa and plavix.  resultant dysarthria, RUE weakness, field cut, speech   Hx cardiomyopathy, EF 25% in the past  Other Active Problems  Chest pain   Renal insufficiency  Hospital day # 0  Kenai Fluegel  [facility] See Amion for Pager information 09/20/2016 2:07 PM  I have personally examined this patient, reviewed notes, independently viewed imaging studies, participated in medical decision making and plan of care.ROS completed by me personally and pertinent positives fully documented  I have made any additions or clarifications directly to the above note. Agree with note above. Patient presented with strokelike symptoms but has normal MRI scan and clear  underlying psychosocial stressors. Recommend psych consult to address his underlying stressors and depression. Continue ongoing stroke evaluation given the fact that he had a previous stroke 1.5 years f ago. Recommend coated 81 mg aspirin for secondary stroke prevention. Greater than 50% time during this 15 minute   visit was spent on counseling and coordination of care about his stroke and TIA risk, prevention and treatment  Delia Heady, MD Medical Director [facility] Pager: [phone] 09/20/2016 2:07 PM  To contact Stroke Continuity provider, please refer to [email or web address]. After hours, contact General Neurology

## 2016-09-20 NOTE — Progress Notes (Signed)
  Echocardiogram 2D Echocardiogram has been performed.  Ryan Howard 09/20/2016, 3:49 PM

## 2016-09-20 NOTE — Progress Notes (Signed)
PROGRESS NOTE    Ryan Howard  ZOX:[id number] DOB: 05/30/1991 DOA: 09/18/2016 PCP: No primary care provider on file.   Brief Narrative: Ryan Howard is a 25 y.o. male with medical history significant of stroke, morbid obesity status post gastric sleeve procedure, ?cardiomyopathy. He presented with chest pain, palpitations and right sided numbness/weakness. Evaluation for CVA has been negative.   Assessment & Plan:   Active Problems:   Right sided weakness   Stroke-like symptoms   Essential hypertension   S/P laparoscopic sleeve gastrectomy   History of CVA (cerebrovascular accident)   Renal insufficiency   Stroke-like symptoms Right sided weakness History of CVA Deficits appear resolved today. Workup so far has been negative. Echo significant for questionable thrombus. Recommendation for cardiac MRI. Discussed with neurology and they agree -neurology recommendations -cardiac MRI in AM  Laparoscopic sleeve gastrectomy -continue pepcid  Chest pain Resolved. Troponin negative. EKG unremarkable for ACS signs  Renal insufficiency Creatinine improved. Likely acute renal failure secondary to prerenal azotemia  History of cardiomyopathy Per patient, has history of EF of 25%. He has not been managed for this. Euvolemic. Echocardiogram significant for slight systolic function reduction with EF of 45-50%   DVT prophylaxis: Heparin subq Code Status: Full code Family Communication: None at bedside Disposition Plan: Discharge home tomorrow pending cardiac MRI   Consultants:   Neurology  Procedures:  None  Antimicrobials:  None    Subjective: Patient reports no issues overnight. He has no residual symptoms.  Objective: Vitals:   09/20/16 0214 09/20/16 0622 09/20/16 1203 09/20/16 1437  BP: (!) 148/110 (!) 144/94 (!) 148/79 (!) 148/99  Pulse: (!) 53 (!) 49 (!) 51 (!) 46  Resp: 18 18 16    Temp: 98.5 F (36.9 C) 98.2 F (36.8 C) 98.4 F (36.9 C) 97.7 F (36.5  C)  TempSrc: Oral Oral Oral Oral  SpO2: 98% 100% 100% 99%  Weight:      Height:        Intake/Output Summary (Last 24 hours) at 09/20/16 1755 Last data filed at 09/20/16 1659  Gross per 24 hour  Intake          2398.33 ml  Output             1100 ml  Net          1298.33 ml   Filed Weights   09/18/16 0900 09/18/16 1104 09/18/16 1820  Weight: 94.4 kg (208 lb 1.8 oz) 94.4 kg (208 lb 1.8 oz) 93.8 kg (206 lb 14.4 oz)    Examination:  General exam: Appears calm and comfortable Respiratory system: Clear to auscultation. Respiratory effort normal. Cardiovascular system: S1 & S2 heard, RRR. No murmurs, rubs, gallops or clicks. Gastrointestinal system: Abdomen is nondistended, soft and nontender. No organomegaly or masses felt. Normal bowel sounds heard. Central nervous system: Alert and oriented. No focal neurological deficits. Extremities: No edema. No calf tenderness Skin: No cyanosis. No rashes Psychiatry: Judgement and insight appear normal. Mood & affect appropriate.     Data Reviewed: I have personally reviewed following labs and imaging studies  CBC:  Recent Labs Lab 09/18/16 0925 09/18/16 0944 09/19/16 0002  WBC 7.2  --  5.9  NEUTROABS 2.9  --   --   HGB 14.7 15.3 13.2  HCT 42.1 45.0 38.2*  MCV 90.9  --  89.7  PLT 168  --  179   Basic Metabolic Panel:  Recent Labs Lab 09/18/16 0925 09/18/16 0944 09/19/16 0002  NA 141 143 141  K  3.5 3.4* 3.4*  CL 107 103 107  CO2 26  --  27  GLUCOSE 85 81 89  BUN 9 10 7   CREATININE 1.34* 1.30* 1.17  CALCIUM 9.7  --  9.0   GFR: Estimated Creatinine Clearance: 112.2 mL/min (by C-G formula based on SCr of 1.17 mg/dL). Liver Function Tests:  Recent Labs Lab 09/18/16 0925 09/19/16 0002  AST 23 18  ALT 25 21  ALKPHOS 36* 34*  BILITOT 1.0 0.8  PROT 7.2 6.4*  ALBUMIN 4.2 3.7   No results for input(s): LIPASE, AMYLASE in the last 168 hours. No results for input(s): AMMONIA in the last 168 hours. Coagulation  Profile:  Recent Labs Lab 09/18/16 0925  INR 1.14   Cardiac Enzymes:  Recent Labs Lab 09/18/16 1629 09/18/16 2054 09/19/16 0002  TROPONINI <0.03 <0.03 <0.03   BNP (last 3 results) No results for input(s): PROBNP in the last 8760 hours. HbA1C:  Recent Labs  09/19/16 0002  HGBA1C 5.3   CBG:  Recent Labs Lab 09/18/16 0937  GLUCAP 84   Lipid Profile:  Recent Labs  09/19/16 0002  CHOL 185  HDL 39*  LDLCALC 127*  TRIG 94  CHOLHDL 4.7   Thyroid Function Tests: No results for input(s): TSH, T4TOTAL, FREET4, T3FREE, THYROIDAB in the last 72 hours. Anemia Panel: No results for input(s): VITAMINB12, FOLATE, FERRITIN, TIBC, IRON, RETICCTPCT in the last 72 hours. Sepsis Labs: No results for input(s): PROCALCITON, LATICACIDVEN in the last 168 hours.  No results found for this or any previous visit (from the past 240 hour(s)).       Radiology Studies: No results found.      Scheduled Meds: . aspirin  300 mg Rectal Daily   Or  . aspirin  325 mg Oral Daily  . clopidogrel  75 mg Oral Daily  . famotidine  40 mg Oral Daily  . heparin  5,000 Units Subcutaneous Q8H  . sodium chloride flush  3 mL Intravenous Q12H  . sodium chloride flush  3 mL Intravenous Q12H   Continuous Infusions: . sodium chloride 100 mL/hr at 09/20/16 1218     LOS: 0 days     Jacquelin Hawking [facility] 09/20/2016, 5:55 PM Pager: [phone]  If 7PM-7AM, please contact night-coverage [email or web address] Password [email or web address] 09/20/2016, 5:55 PM

## 2016-09-21 ENCOUNTER — Observation Stay (HOSPITAL_COMMUNITY): Payer: 59

## 2016-09-21 ENCOUNTER — Encounter (HOSPITAL_COMMUNITY): Payer: Self-pay | Admitting: Cardiology

## 2016-09-21 DIAGNOSIS — R531 Weakness: Secondary | ICD-10-CM | POA: Diagnosis not present

## 2016-09-21 DIAGNOSIS — I10 Essential (primary) hypertension: Secondary | ICD-10-CM | POA: Diagnosis not present

## 2016-09-21 DIAGNOSIS — R079 Chest pain, unspecified: Secondary | ICD-10-CM | POA: Diagnosis not present

## 2016-09-21 DIAGNOSIS — Z8673 Personal history of transient ischemic attack (TIA), and cerebral infarction without residual deficits: Secondary | ICD-10-CM | POA: Diagnosis not present

## 2016-09-21 DIAGNOSIS — R200 Anesthesia of skin: Secondary | ICD-10-CM | POA: Diagnosis not present

## 2016-09-21 DIAGNOSIS — R931 Abnormal findings on diagnostic imaging of heart and coronary circulation: Secondary | ICD-10-CM | POA: Diagnosis not present

## 2016-09-21 DIAGNOSIS — N289 Disorder of kidney and ureter, unspecified: Secondary | ICD-10-CM | POA: Diagnosis not present

## 2016-09-21 DIAGNOSIS — I428 Other cardiomyopathies: Secondary | ICD-10-CM | POA: Diagnosis not present

## 2016-09-21 DIAGNOSIS — R2990 Unspecified symptoms and signs involving the nervous system: Secondary | ICD-10-CM | POA: Diagnosis not present

## 2016-09-21 DIAGNOSIS — I5043 Acute on chronic combined systolic (congestive) and diastolic (congestive) heart failure: Secondary | ICD-10-CM

## 2016-09-21 DIAGNOSIS — R471 Dysarthria and anarthria: Secondary | ICD-10-CM | POA: Diagnosis not present

## 2016-09-21 LAB — TROPONIN I: Troponin I: <0.03 ng/mL (ref <0.03)

## 2016-09-21 MED ORDER — GADOBENATE DIMEGLUMINE 529 MG/ML IV SOLN
30.0000 mL | Freq: Once | INTRAVENOUS | Status: AC | PRN
  Administered 2016-09-21: 30 mL via INTRAVENOUS

## 2016-09-21 MED ORDER — LISINOPRIL 10 MG PO TABS: 10.0000 mg | ORAL_TABLET | Freq: Every day | ORAL | Status: DC

## 2016-09-21 MED ORDER — LISINOPRIL 10 MG PO TABS: 10.0000 mg | ORAL_TABLET | Freq: Every day | ORAL | 0 refills | Status: DC

## 2016-09-21 NOTE — Discharge Summary (Signed)
Physician Discharge Summary  Ryan Howard:[id number] DOB: [birth year] DOA: 09/18/2016  PCP: No primary care provider on file.  Admit date: 09/18/2016 Discharge date: 09/21/2016  Admitted From: Home Disposition:  Home  Recommendations for Outpatient Follow-up:  1. Follow up with PCP in 1 week 2. Follow-up with cardiology in 1-2 weeks for cardiomyopathy 3. Repeat BMP in 2 weeks since starting lisinopril] 4. Patient with some social stressors that may be contributing. Recommend addressing these  Discharge Condition: Stable CODE STATUS: Full code Diet recommendation: Heart healthy   Brief/Interim Summary:  HPI written by Dr. Shelly Flatten on 09/18/2016  HPI: Ryan Howard is a 25 y.o. male with medical history significant of stroke, morbid obesity status post gastric sleeve procedure, presenting with two-day history of chest pain and one-day history of R-sided numbness and weakness. Patient describes onset of chest pain gradually last night while resting just prior to bed. Associated with a "noticeable heartbeat. " As any diaphoresis, nausea, shortness of breath or radiation of pain to shoulder or neck. Patient states he is able to go to bed despite the pain. Woke up this morning initially with minimal pain which went away spontaneously when patient developed R-sided numbness and weakness. Symptoms of numbness and weakness began acutely while sitting in his class performing schoolwork. Involvement includes the RLE, RUE and R face in the V2 and V3 distributions. Slowly improving. Patient is not on any antiplatelet therapy due to history of gastric bypass and sleeve. Patient states that he has had residual right hand numbness since previous stroke 1 year ago but no other permanent deficits.  ED Course: The findings outlined below. Code stroke team called for initial evaluation and following.   Hospital course:  Stroke-like symptoms Right sided weakness History of CVA Patient  evaluated by neurology. CT and MRI unremarkable for acute stroke. Symptoms resolved. Workup so far has been negative. Echo significant for questionable thrombus ruled out by cardiac MRI. 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  Laparoscopic sleeve gastrectomy -continue pepcid  Chest pain Resolved with GI cocktail. Troponin negative. EKG unremarkable for ACS signs. Cardiology evaluated and felt like not secondary to ACS.  Renal insufficiency Creatinine improved with fluid. 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 He has not been managed for this. Euvolemic. Echocardiogram significant for slight systolic function reduction with EF of 45-50%. Cardiac MRI revealed diagnosis of non-compaction cardiomyopathy. Cardiology consulted.   Discharge Diagnoses:  Principal Problem:   Stroke-like symptoms Active Problems:   Right sided weakness   Essential hypertension   S/P laparoscopic sleeve gastrectomy   History of CVA (cerebrovascular accident)   Renal insufficiency   Non-compaction cardiomyopathy (HCC)    Discharge Instructions     Medication List    TAKE these medications   aspirin EC 81 MG tablet Take 1 tablet (81 mg total) by mouth daily.   lisinopril 10 MG tablet Commonly known as:  PRINIVIL,ZESTRIL Take 1 tablet (10 mg total) by mouth daily.      Follow-up Information    Primary care physician in [place]. Schedule an appointment as soon as possible for a visit in 2 week(s).        Chilton Si, MD .   Specialty:  Cardiology Why:  the office will call with date and time Contact information: [address] [place] [address] [phone]  No Known Allergies  Consultations:  Cardiology   Procedures/Studies: Ct Angio Head W Or Wo Contrast  Result Date: 09/18/2016 CLINICAL  DATA:  25 year old male code stroke. Right side symptoms and abnormal speech. Abnormal posterior left MCA cortex on noncontrast head CT, but appeared more subacute to chronic rather than acute. Initial encounter. EXAM: CT ANGIOGRAPHY HEAD AND NECK CT PERFUSION BRAIN TECHNIQUE: Multidetector CT perfusion omitted imaging of the brain during bolus IV contrast administration. Post processing using the RAPID software system. Multi detector CT Imaging of the head and neck was performed using the standard protocol during bolus administration of intravenous contrast. Multiplanar CT image reconstructions and MIPs were obtained to evaluate the vascular anatomy. Carotid stenosis measurements (when applicable) are obtained utilizing NASCET criteria, using the distal internal carotid diameter as the denominator. CONTRAST:  50 mL Isovue 370 COMPARISON:  Noncontrast head CT 0935 hours today. FINDINGS: CT BRAIN PERFUSION FINDINGS RAPID post processing of CT perfusion reveals no evidence of cerebral core infarct (designated as CBF less than 30%) or penumbra (designated as Tmax greater than 6 seconds). Cerebral perfusion maps appear essentially symmetric and within normal limits. CTA NECK Skeleton: No acute osseous abnormality identified. Visualized paranasal sinuses and mastoids are stable and well pneumatized. Upper chest: Negative lung apices. No superior mediastinal lymphadenopathy. Other neck: Negative thyroid, larynx, pharynx, parapharyngeal spaces, retropharyngeal space, sublingual space, submandibular glands and parotid glands. No cervical lymphadenopathy. Aortic arch: 3 vessel arch configuration with no arch or proximal great vessel atherosclerosis or stenosis. Right carotid system: Negative. Left carotid system: Negative. Vertebral arteries:No proximal subclavian artery stenosis. Dominant right vertebral artery. Normal right vertebral artery origin. Normal cervical right vertebral artery. Non dominant left vertebral artery  has a normal origin. The left vertebral is somewhat diminutive, but patent and otherwise normal throughout the neck. CTA HEAD Posterior circulation: Dominant distal right vertebral artery is normal. Right PICA origin is normal. Left PICA origin is normal. The left V4 segment is highly diminutive but patent to the vertebrobasilar junction. Normal basilar artery. Normal SCA and right PCA origins. Fetal type left PCA origin. Right posterior communicating artery also is present. PCA branches are within normal limits. Anterior circulation: Both ICA siphons are patent and normal. Normal ophthalmic and posterior communicating artery origins. Normal carotid termini. Normal MCA and ACA origins. Anterior communicating artery and bilateral ACA branches are normal. Right MCA M1 segment, bifurcation, and right MCA branches are normal. Left MCA M1 segment and bifurcation are normal. Proximal left MCA branches are normal. Questionable mild posterior left in 3 branch irregularity. No left MCA branch occlusion identified. Venous sinuses: Patent. Anatomic variants: Dominant right vertebral artery. Fetal type left PCA origin. Review of the MIP images confirms the above findings IMPRESSION: 1. Negative for emergent large vessel occlusion. No left MCA branch occlusion identified. 2. Negative CT Perfusion findings. 3. Questionable mild irregularity of distal posterior left MCA branches. Otherwise normal arterial findings on CTA head and neck. 4. Consider PFO or other source of paradoxical emboli in this clinical setting. 5. Preliminary findings of the above discussed by telephone with Dr. Lucia GaskinsAhern on 09/18/2016 at 1011 hours. She wants to pursue limited diffusion MR imaging of the brain, which is pending at this time. Electronically Signed   By: Odessa FlemingH  Hall M.D.   On: 09/18/2016 10:30   Ct Angio Neck W Or Wo Contrast  Result Date: 09/18/2016 CLINICAL DATA:  25 year old male code stroke. Right side symptoms and abnormal speech. Abnormal  posterior left MCA cortex on noncontrast head  CT, but appeared more subacute to chronic rather than acute. Initial encounter. EXAM: CT ANGIOGRAPHY HEAD AND NECK CT PERFUSION BRAIN TECHNIQUE: Multidetector CT perfusion omitted imaging of the brain during bolus IV contrast administration. Post processing using the RAPID software system. Multi detector CT Imaging of the head and neck was performed using the standard protocol during bolus administration of intravenous contrast. Multiplanar CT image reconstructions and MIPs were obtained to evaluate the vascular anatomy. Carotid stenosis measurements (when applicable) are obtained utilizing NASCET criteria, using the distal internal carotid diameter as the denominator. CONTRAST:  50 mL Isovue 370 COMPARISON:  Noncontrast head CT 0935 hours today. FINDINGS: CT BRAIN PERFUSION FINDINGS RAPID post processing of CT perfusion reveals no evidence of cerebral core infarct (designated as CBF less than 30%) or penumbra (designated as Tmax greater than 6 seconds). Cerebral perfusion maps appear essentially symmetric and within normal limits. CTA NECK Skeleton: No acute osseous abnormality identified. Visualized paranasal sinuses and mastoids are stable and well pneumatized. Upper chest: Negative lung apices. No superior mediastinal lymphadenopathy. Other neck: Negative thyroid, larynx, pharynx, parapharyngeal spaces, retropharyngeal space, sublingual space, submandibular glands and parotid glands. No cervical lymphadenopathy. Aortic arch: 3 vessel arch configuration with no arch or proximal great vessel atherosclerosis or stenosis. Right carotid system: Negative. Left carotid system: Negative. Vertebral arteries:No proximal subclavian artery stenosis. Dominant right vertebral artery. Normal right vertebral artery origin. Normal cervical right vertebral artery. Non dominant left vertebral artery has a normal origin. The left vertebral is somewhat diminutive, but patent and  otherwise normal throughout the neck. CTA HEAD Posterior circulation: Dominant distal right vertebral artery is normal. Right PICA origin is normal. Left PICA origin is normal. The left V4 segment is highly diminutive but patent to the vertebrobasilar junction. Normal basilar artery. Normal SCA and right PCA origins. Fetal type left PCA origin. Right posterior communicating artery also is present. PCA branches are within normal limits. Anterior circulation: Both ICA siphons are patent and normal. Normal ophthalmic and posterior communicating artery origins. Normal carotid termini. Normal MCA and ACA origins. Anterior communicating artery and bilateral ACA branches are normal. Right MCA M1 segment, bifurcation, and right MCA branches are normal. Left MCA M1 segment and bifurcation are normal. Proximal left MCA branches are normal. Questionable mild posterior left in 3 branch irregularity. No left MCA branch occlusion identified. Venous sinuses: Patent. Anatomic variants: Dominant right vertebral artery. Fetal type left PCA origin. Review of the MIP images confirms the above findings IMPRESSION: 1. Negative for emergent large vessel occlusion. No left MCA branch occlusion identified. 2. Negative CT Perfusion findings. 3. Questionable mild irregularity of distal posterior left MCA branches. Otherwise normal arterial findings on CTA head and neck. 4. Consider PFO or other source of paradoxical emboli in this clinical setting. 5. Preliminary findings of the above discussed by telephone with Dr. Lucia Gaskins on 09/18/2016 at 1011 hours. She wants to pursue limited diffusion MR imaging of the brain, which is pending at this time. Electronically Signed   By: Odessa Fleming M.D.   On: 09/18/2016 10:30   Mr Brain Wo Contrast  Result Date: 09/18/2016 CLINICAL DATA:  25 year old male code stroke presentation today with right side symptoms. Subacute to chronic appearing posterior left MCA ischemia on noncontrast CT, with largely negative  CTA/CTP findings. Initial encounter. EXAM: MRI HEAD WITHOUT CONTRAST TECHNIQUE: Multiplanar, multiecho pulse sequences of the brain and surrounding structures were obtained without intravenous contrast. COMPARISON:  CTA, CT perfusion and CT head from today. FINDINGS: Brain: No restricted diffusion  or evidence of acute infarction. Mild encephalomalacia with Facilitated cortical diffusion along the posterior left sylvian fissure corresponding to the cortical abnormality on CT today. Elsewhere normal gray and white matter signal. No chronic cerebral blood products identified. No midline shift, mass effect, evidence of mass lesion, ventriculomegaly, extra-axial collection or acute intracranial hemorrhage. Cervicomedullary junction and pituitary are within normal limits. Vascular: Major intracranial vascular flow voids are preserved. Skull and upper cervical spine: Negative. Normal bone marrow signal. Sinuses/Orbits: Negative orbits. Visualized paranasal sinuses and mastoids are stable and well pneumatized. Other: Negative scalp soft tissues. Visible internal auditory structures appear normal. IMPRESSION: 1. Negative for acute infarct or acute intracranial abnormality. 2. Small area of chronic ischemia in the posterior left MCA territory, corresponding to the CT findings today. 3. Preliminary report of the above discussed by telephone with Dr. Lucia Gaskins on 09/18/2016 At 1045 hours. Electronically Signed   By: Odessa Fleming M.D.   On: 09/18/2016 11:07   Ct Cerebral Perfusion W Contrast  Result Date: 09/18/2016 CLINICAL DATA:  25 year old male code stroke. Right side symptoms and abnormal speech. Abnormal posterior left MCA cortex on noncontrast head CT, but appeared more subacute to chronic rather than acute. Initial encounter. EXAM: CT ANGIOGRAPHY HEAD AND NECK CT PERFUSION BRAIN TECHNIQUE: Multidetector CT perfusion omitted imaging of the brain during bolus IV contrast administration. Post processing using the RAPID software  system. Multi detector CT Imaging of the head and neck was performed using the standard protocol during bolus administration of intravenous contrast. Multiplanar CT image reconstructions and MIPs were obtained to evaluate the vascular anatomy. Carotid stenosis measurements (when applicable) are obtained utilizing NASCET criteria, using the distal internal carotid diameter as the denominator. CONTRAST:  50 mL Isovue 370 COMPARISON:  Noncontrast head CT 0935 hours today. FINDINGS: CT BRAIN PERFUSION FINDINGS RAPID post processing of CT perfusion reveals no evidence of cerebral core infarct (designated as CBF less than 30%) or penumbra (designated as Tmax greater than 6 seconds). Cerebral perfusion maps appear essentially symmetric and within normal limits. CTA NECK Skeleton: No acute osseous abnormality identified. Visualized paranasal sinuses and mastoids are stable and well pneumatized. Upper chest: Negative lung apices. No superior mediastinal lymphadenopathy. Other neck: Negative thyroid, larynx, pharynx, parapharyngeal spaces, retropharyngeal space, sublingual space, submandibular glands and parotid glands. No cervical lymphadenopathy. Aortic arch: 3 vessel arch configuration with no arch or proximal great vessel atherosclerosis or stenosis. Right carotid system: Negative. Left carotid system: Negative. Vertebral arteries:No proximal subclavian artery stenosis. Dominant right vertebral artery. Normal right vertebral artery origin. Normal cervical right vertebral artery. Non dominant left vertebral artery has a normal origin. The left vertebral is somewhat diminutive, but patent and otherwise normal throughout the neck. CTA HEAD Posterior circulation: Dominant distal right vertebral artery is normal. Right PICA origin is normal. Left PICA origin is normal. The left V4 segment is highly diminutive but patent to the vertebrobasilar junction. Normal basilar artery. Normal SCA and right PCA origins. Fetal type left  PCA origin. Right posterior communicating artery also is present. PCA branches are within normal limits. Anterior circulation: Both ICA siphons are patent and normal. Normal ophthalmic and posterior communicating artery origins. Normal carotid termini. Normal MCA and ACA origins. Anterior communicating artery and bilateral ACA branches are normal. Right MCA M1 segment, bifurcation, and right MCA branches are normal. Left MCA M1 segment and bifurcation are normal. Proximal left MCA branches are normal. Questionable mild posterior left in 3 branch irregularity. No left MCA branch occlusion identified. Venous sinuses: Patent. Anatomic  variants: Dominant right vertebral artery. Fetal type left PCA origin. Review of the MIP images confirms the above findings IMPRESSION: 1. Negative for emergent large vessel occlusion. No left MCA branch occlusion identified. 2. Negative CT Perfusion findings. 3. Questionable mild irregularity of distal posterior left MCA branches. Otherwise normal arterial findings on CTA head and neck. 4. Consider PFO or other source of paradoxical emboli in this clinical setting. 5. Preliminary findings of the above discussed by telephone with Dr. Lucia Gaskins on 09/18/2016 at 1011 hours. She wants to pursue limited diffusion MR imaging of the brain, which is pending at this time. Electronically Signed   By: Odessa Fleming M.D.   On: 09/18/2016 10:30   Mr Cardiac Morphology W Wo Contrast  Result Date: 09/21/2016 CLINICAL DATA:  25 year old male with systolic dysfunction on echocardiogram and suspicion for a non-compaction cardiomyopathy. EXAM: CARDIAC MRI TECHNIQUE: The patient was scanned on a 1.5 Tesla GE magnet. A dedicated cardiac coil was used. Functional imaging was done using Fiesta sequences. 2,3, and 4 chamber views were done to assess for RWMA's. Modified Simpson's rule using a short axis stack was used to calculate an ejection fraction on a dedicated work [employer]. The patient  received 30 cc of Multihance. After 10 minutes inversion recovery sequences were used to assess for infiltration and scar tissue. CONTRAST:  30 cc  of Multihance FINDINGS: 1. Moderately dilated left ventricle with mild concentric hypertrophy and mildly decreased systolic function (LVEF = 45%). There is diffuse hypokinesis. There is a non-compacted to compacted myocardium ratio > 2.6:2. LVEDD:  64 mm LVESD:  43 mm LVEDV:  237 ml LVESV:  129 ml SV:  108 ml CO:  5.6 L/minute Myocardial mass:  227 g 2. Normal right ventricular size, thickness and systolic function (RVEF = 53%) with no regional wall motion abnormalities. 3.  Normal biatrial size. 4. Normal size of the pulmonary artery, aortic root and ascending aorta. 5.  Trivial mitral and tricuspid regurgitation. 6. No late gadolinium enhancement seen in the left and right myocardium. IMPRESSION: 1. Moderately dilated left ventricle with mild concentric hypertrophy and mildly decreased systolic function (LVEF = 45%). There is diffuse hypokinesis. Left ventricle is hypertrabeculated with non-compacted to compacted myocardium ratio > 2.6:2. Collectively, these findings are consistent with non-compaction cardiomyopathy. 2. Normal right ventricular size, thickness and systolic function (RVEF = 53%) with no regional wall motion abnormalities. 3.  Normal biatrial size. 4. Normal size of the pulmonary artery, aortic root and ascending aorta. 5.  Trivial mitral and tricuspid regurgitation. 6. No late gadolinium enhancement seen in the left and right myocardium. Tobias Alexander Electronically Signed   By: Tobias Alexander   On: 09/21/2016 14:53   Ct Head Code Stroke W/o Cm  Result Date: 09/18/2016 CLINICAL DATA:  Code stroke. 25 year old male with right side weakness and difficulty speaking. Initial encounter. EXAM: CT HEAD WITHOUT CONTRAST TECHNIQUE: Contiguous axial images were obtained from the base of the skull through the vertex without intravenous contrast.  COMPARISON:  None. FINDINGS: Brain: Positive for abnormal cortex and subcortical white matter along the posterior left sylvian fissure and the superior lateral left peri-Rolandic cortex. See series 201, images 16- 22. However, at least some of this appears subacute to chronic. No associated mass effect. Gray-white matter differentiation elsewhere is within normal limits. No acute intracranial hemorrhage identified. No ventriculomegaly. Vascular: Anterior circulation vasculature appears symmetric without suspicious intracranial vascular hyperdensity. Skull: Visualized osseous structures are within normal limits. Sinuses/Orbits: Clear. Other: Negative orbit  and scalp soft tissues. ASPECTS ([place] Stroke Program Early CT Score) The following is assuming all of the posterior left MCA territory abnormal cortex is acute rather than subacute to chronic. - Ganglionic level infarction (caudate, lentiform nuclei, internal capsule, insula, M1-M3 cortex): 7 - Supraganglionic infarction (M4-M6 cortex): 1 Total score (0-10 with 10 being normal): 8 IMPRESSION: 1. Evidence of cortically based infarct in the posterior left MCA territory, but appears more subacute to chronic than acute. No associated hemorrhage or mass effect. No other changes of acute cortically based infarct. 2. ASPECTS is 8, BUT this is assuming that all of the findings in #1 are acute. 3. Study discussed by telephone with Dr. Lucia Gaskins on 09/18/2016 at 09:51 . Electronically Signed   By: Odessa Fleming M.D.   On: 09/18/2016 09:52       Echocardiogram (09/20/2016) Study Conclusions  - Left ventricle: There is a laminar area of echodensity in the   inferolateral wall distally with some trabeculations. Difficult   to tell if this is noncompaction of the ventricle or thrombus, or   prominent trabeculations. Suggest cardiac MRI to further   characterize. The cavity size was normal. Systolic function was   mildly reduced. The estimated ejection fraction was in the  range   of 45% to 50%. Wall motion was normal; there were no regional   wall motion abnormalities.   Subjective: Patient reports no chest pain or dyspnea.  Discharge Exam: Vitals:   09/21/16 0116 09/21/16 1344  BP: 132/74 (!) 151/98  Pulse: (!) 57 64  Resp: 18 16  Temp: 98 F (36.7 C)    Vitals:   09/20/16 2002 09/20/16 2233 09/21/16 0116 09/21/16 1344  BP: (!) 152/96 (!) 158/78 132/74 (!) 151/98  Pulse: (!) 57 68 (!) 57 64  Resp: (!) 26 18 18 16   Temp:  98.2 F (36.8 C) 98 F (36.7 C)   TempSrc:  Oral Oral   SpO2: 100% 99% 100% 99%  Weight:      Height:        General exam: Appears calm and comfortable Respiratory system: Clear to auscultation. Respiratory effort normal. Cardiovascular system: S1 & S2 heard, RRR. No murmurs, rubs, gallops or clicks. Gastrointestinal system: Abdomen is nondistended, soft and nontender. No organomegaly or masses felt. Normal bowel sounds heard. Central nervous system: Alert and oriented. No focal neurological deficits. 5/5 strength in all 4 extremities Extremities: No edema. No calf tenderness Skin: No cyanosis. No rashes Psychiatry: Judgement and insight appear normal. Mood & affect appropriate.   The results of significant diagnostics from this hospitalization (including imaging, microbiology, ancillary and laboratory) are listed below for reference.     Microbiology: No results found for this or any previous visit (from the past 240 hour(s)).   Labs: Basic Metabolic Panel:  Recent Labs Lab 09/18/16 0925 09/18/16 0944 09/19/16 0002  NA 141 143 141  K 3.5 3.4* 3.4*  CL 107 103 107  CO2 26  --  27  GLUCOSE 85 81 89  BUN 9 10 7   CREATININE 1.34* 1.30* 1.17  CALCIUM 9.7  --  9.0   Liver Function Tests:  Recent Labs Lab 09/18/16 0925 09/19/16 0002  AST 23 18  ALT 25 21  ALKPHOS 36* 34*  BILITOT 1.0 0.8  PROT 7.2 6.4*  ALBUMIN 4.2 3.7   CBC:  Recent Labs Lab 09/18/16 0925 09/18/16 0944 09/19/16 0002  WBC  7.2  --  5.9  NEUTROABS 2.9  --   --  HGB 14.7 15.3 13.2  HCT 42.1 45.0 38.2*  MCV 90.9  --  89.7  PLT 168  --  179   Cardiac Enzymes:  Recent Labs Lab 09/18/16 1629 09/18/16 2054 09/19/16 0002 09/21/16 1018  TROPONINI <0.03 <0.03 <0.03 <0.03   CBG:  Recent Labs Lab 09/18/16 0937  GLUCAP 84   Hgb A1c  Recent Labs  09/19/16 0002  HGBA1C 5.3   Lipid Profile  Recent Labs  09/19/16 0002  CHOL 185  HDL 39*  LDLCALC 127*  TRIG 94  CHOLHDL 4.7   Urinalysis    Component Value Date/Time   COLORURINE YELLOW 09/18/2016 1317   APPEARANCEUR CLEAR 09/18/2016 1317   LABSPEC 1.037 (H) 09/18/2016 1317   PHURINE 6.0 09/18/2016 1317   GLUCOSEU NEGATIVE 09/18/2016 1317   HGBUR NEGATIVE 09/18/2016 1317   BILIRUBINUR NEGATIVE 09/18/2016 1317   KETONESUR NEGATIVE 09/18/2016 1317   PROTEINUR 30 (A) 09/18/2016 1317   NITRITE NEGATIVE 09/18/2016 1317   LEUKOCYTESUR NEGATIVE 09/18/2016 1317     Time coordinating discharge: Over 30 minutes  SIGNED:   Jacquelin Hawking, MD [facility] 09/21/2016, 6:54 PM Pager [phone]  If 7PM-7AM, please contact night-coverage [email or web address] Password [email or web address]

## 2016-09-21 NOTE — Consult Note (Signed)
Reason for Consult: chest pain    Referring Physician: Dr. Caleb Popp   PCP:  No primary care provider on file.  Primary Cardiologist:new Dr. [name] is an 25 y.o. male.    Chief Complaint: pt admitted 09/19/16 with Code Stroke   HPI:  Asked to see 55 yom with no prior cardiac hx admitted with code stroke.   Pt in class and suddenly felt his Rt arm become weak and couldn not move it and his speech was slurred.  He also noted pt had had a stroke in July 2016. Records obtained. MRI brain 06/2015 which showed an acute infarct in the left inferior parietal lobe with involvement of left postcentral gyrus. Patient did receive TPA at that time and no complications.  Echo at that time revealed moderate global hypokinesis of the left ventricle with moderately reduced left ventricular systolic function and an EF of only 25%. Patient was placed on Coreg, ASA and Plavix.  Other hx of morbid obesity with gastric sleeve in 09/2015 per pt. At that time ASA and plavix stopped.  Not sure pt could take coreg wit HR her in 40s freq.  He has lost > 100 lbs since his gastric sleeve.   On neuro exam sx's inconsistent and CTA of head and neck were normal and MRI limited was without stroke.   Yesterday pt complained of chest pain 5/10, pain with chest pressure, after GI cocktail it resolved.  Troponin < 0.03 X 4 poc was 0.01, homocysteine was 16.6.   LDL is 127 and HDL 39, TG 94 and Tchol 185  EKGs SR to SB as low as 48, but otherwise normal EKGs.    Echo 09/20/16 :  Study Conclusions  - Left ventricle: There is a laminar area of echodensity in the   inferolateral wall distally with some trabeculations. Difficult   to tell if this is noncompaction of the ventricle or thrombus, or   prominent trabeculations. Suggest cardiac MRI to further   characterize. The cavity size was normal. Systolic function was   mildly reduced. The estimated ejection fraction was in the range   of 45% to  50%. Wall motion was normal; there were no regional   wall motion abnormalities.  Pt has had cardiac MRI and results pending.   Currently pain free.  No complaints.    Tele:  With SR and SB to 42.   Hx of sleep apnea, but improved.  Has played football most of life.    Past Medical History:  Diagnosis Date  . CVA (cerebral vascular accident) (HCC)   . Depression   . Obesity   . 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 Stroke [facility])     Past Surgical History:  Procedure Laterality Date  . Laparoscopic sleeve gastrectomy      Family History  Problem Relation Age of Onset  . Stroke Paternal Grandfather   . Diabetes Other   . Heart disease Other   . Stroke Other    Social History:  reports that he has never smoked. He has never used smokeless tobacco. He reports that he does not drink alcohol or use drugs.  Allergies: No Known Allergies  OUTPATIENT MEDICATIONS: No current facility-administered medications on file prior to encounter.    No current outpatient prescriptions on file prior to encounter.   CURRENT MEDICATIONS: Scheduled Meds: . aspirin  300 mg Rectal Daily  Or  . aspirin  325 mg Oral Daily  . clopidogrel  75 mg Oral Daily  . famotidine  40 mg Oral Daily  . heparin  5,000 Units Subcutaneous Q8H  . sodium chloride flush  3 mL Intravenous Q12H  . sodium chloride flush  3 mL Intravenous Q12H   Continuous Infusions:  PRN Meds:.sodium chloride, acetaminophen, ALPRAZolam, gi cocktail, hydrALAZINE, ondansetron (ZOFRAN) IV, sodium chloride flush   Results for orders placed or performed during the hospital encounter of 09/18/16 (from the past 48 hour(s))  Troponin I     Status: None   Collection Time: 09/21/16 10:18 AM  Result Value Ref Range   Troponin I <0.03 <0.03 ng/mL   No results found.  ROS: General:no colds or fevers, + weight loss Skin:no rashes or ulcers HEENT:no blurred vision, no congestion CV:see  HPI PUL:see HPI GI:no diarrhea constipation or melena, no indigestion GU:no hematuria, no dysuria MS:no joint pain, no claudication Neuro:no syncope, no lightheadedness Endo:no diabetes, no thyroid disease   Blood pressure (!) 151/98, pulse 64, temperature 98 F (36.7 C), temperature source Oral, resp. rate 16, height 6\' 2"  (1.88 m), weight 206 lb 14.4 oz (93.8 kg), SpO2 99 %.  Wt Readings from Last 3 Encounters:  09/18/16 206 lb 14.4 oz (93.8 kg)    PE: General:Pleasant affect, NAD Skin:Warm and dry, brisk capillary refill HEENT:normocephalic, sclera clear, mucus membranes moist Neck:supple, no JVD, no bruits  Heart:S1S2 RRR without murmur, gallup, rub or click Lungs:clear without rales, rhonchi, or wheezes ZOX:WRUEAbd:soft, non tender, + BS, do not palpate liver spleen or masses Ext:no lower ext edema, 2+ pedal pulses, 2+ radial pulses Neuro:alert and oriented X 3, MAE, follows commands, + facial symmetry Psych:  Appropriate with pleasant affect  Assessment/Plan Active Problems:   Right sided weakness   Stroke-like symptoms   Essential hypertension   S/P laparoscopic sleeve gastrectomy   History of CVA (cerebrovascular accident)   Renal insufficiency   Hx of cardiomyopathy  Chest pain negative MI -EKG without acute changes.   Hx cardiomyopathy with EF now 45-50% ? Clot on Echo, Cardiac MRI pending.  Hx CVA, none this admit but stroke like symptoms.  HTN, currently elevated     Nada BoozerLaura Kathalina Ostermann  Nurse Practitioner Certified [facility] Pager [phone] or after 5pm or weekends call [phone] 09/21/2016, 2:00 PM

## 2016-09-21 NOTE — Progress Notes (Signed)
CSW consulted for depression.  CSW met with pt to discuss- pt is depressed due to current relationship issues- CSW discussed in detail for an hour with pt and provided pt with outpatient resources.  Pt did not express depressed feelings due to recent stroke and states relationship is main source of stress.  CSW signing off  Jorge Ny, LCSW Clinical Social Worker [phone]

## 2016-09-21 NOTE — Discharge Instructions (Signed)
Mr. Ryan Howard, you were admitted for concern you had a stroke or transient ischemic attack (TIA). Neurology evaluated you and feels this is reactivation of previous deficits. It is unclear if you had a new stroke/TIA. I recommend follow-up with a primary care physician to help with your current social stressors as those may be an exacerbating factor. Please follow-up with a cardiologist in [place]. You will need repeat blood work (metabolic panel) in 2 weeks to check your kidney function.

## 2016-09-21 NOTE — Progress Notes (Addendum)
CM received consult: Patient needs PCP info. Returning back home in [place].  CM provided pt with [facility] information to help establish PCP. Pt also states has PCP in [place]. Gae Gallop RN,BSN,CM

## 2016-09-21 NOTE — Progress Notes (Signed)
STROKE TEAM PROGRESS NOTE   SUBJECTIVE (INTERVAL HISTORY) Patient scheduled for cardiac MRI given abnormal echo s/o LV apex clot  OBJECTIVE Temp:  [97.7 F (36.5 C)-98.4 F (36.9 C)] 98 F (36.7 C) (10/20 0116) Pulse Rate:  [46-68] 57 (10/20 0116) Cardiac Rhythm: Normal sinus rhythm (10/19 2100) Resp:  [16-26] 18 (10/20 0116) BP: (132-158)/(74-99) 132/74 (10/20 0116) SpO2:  [99 %-100 %] 100 % (10/20 0116)  CBC:   Recent Labs Lab 09/18/16 0925 09/18/16 0944 09/19/16 0002  WBC 7.2  --  5.9  NEUTROABS 2.9  --   --   HGB 14.7 15.3 13.2  HCT 42.1 45.0 38.2*  MCV 90.9  --  89.7  PLT 168  --  179    Basic Metabolic Panel:   Recent Labs Lab 09/18/16 0925 09/18/16 0944 09/19/16 0002  NA 141 143 141  K 3.5 3.4* 3.4*  CL 107 103 107  CO2 26  --  27  GLUCOSE 85 81 89  BUN 9 10 7   CREATININE 1.34* 1.30* 1.17  CALCIUM 9.7  --  9.0    Lipid Panel:     Component Value Date/Time   CHOL 185 09/19/2016 0002   TRIG 94 09/19/2016 0002   HDL 39 (L) 09/19/2016 0002   CHOLHDL 4.7 09/19/2016 0002   VLDL 19 09/19/2016 0002   LDLCALC 127 (H) 09/19/2016 0002   HgbA1c:  Lab Results  Component Value Date   HGBA1C 5.3 09/19/2016   Urine Drug Screen:     Component Value Date/Time   LABOPIA NONE DETECTED 09/18/2016 1317   COCAINSCRNUR NONE DETECTED 09/18/2016 1317   LABBENZ NONE DETECTED 09/18/2016 1317   AMPHETMU NONE DETECTED 09/18/2016 1317   THCU NONE DETECTED 09/18/2016 1317   LABBARB NONE DETECTED 09/18/2016 1317      IMAGING  No results found.  2D Echocardiogram  - Left ventricle: There is a laminar area of echodensity in the inferolateral wall distally with some trabeculations. Difficult to tell if this is noncompaction of the ventricle or thrombus, or prominent trabeculations. Suggest cardiac MRI to further characterize. The cavity size was normal. Systolic function was mildly reduced. The estimated ejection fraction was in the range of 45% to 50%. Wall  motion was normal; there were no regional wall motion abnormalities.   PHYSICAL EXAM  Pleasant young african [place] male not in distress. . Afebrile. Head is nontraumatic. Neck is supple without bruit.    Cardiac exam no murmur or gallop. Lungs are clear to auscultation. Distal pulses are well felt. Neurological Exam ;  Awake  Alert oriented x 3. Normal speech and language.eye movements full without nystagmus.fundi were not visualized. Vision acuity and fields appear normal. Hearing is normal. Palatal movements are normal. Face symmetric. Tongue midline. Normal strength, tone, reflexes and coordination with only slight give away weakness in right hand. Mild subjective right cheek paresthesias.. Normal sensation. Gait deferred.   ASSESSMENT/PLAN Mr. Ryan Howard is a 25 y.o. male with history of stroke in 2016, morbid obesity s/p gastric sleeve procedure presenting with R sided weakness. He did not receive IV t-PA due to improving symptoms and mild NIHSS.   Possible reactivation of previous stroke deficits versus conversion reaction  MRI  No acute infarct  CTA head and neck no large vessel occlusion  CT perfusion no penumbra  2D Echo  EF 45-50% aminar area of echodensity in the inferolateral wall distally with some trabeculations  Based on 2D, cardiac MRI ordered   LDL 127  HgbA1c 5.3  Heparin 5000 units sq tid for VTE prophylaxis Diet Heart Room service appropriate? Yes; Fluid consistency: Thin  No antithrombotic prior to admission, now on aspirin 325 mg daily and clopidogrel 75 mg daily. Given prior stroke, patient should be discharged on asa daily for secondary stroke prevention. No indication to change/add antiplatelets at this time from stroke standpoint    Patient counseled to be compliant with his antithrombotic medications  Ongoing aggressive stroke risk factor management  Therapy recommendations:  pending   Disposition:  pending      Hyperlipidemia  Home  meds:  No statin  LDL 127, goal < 70  Add statin  Other Stroke Risk Factors  Hx morbid Obesity s/p gastric sleeve bariatric surger, now Body mass index is 26.56 kg/m.  Hx stroke/TIA  July/2016 - L  MCA parietal and postcentral gyrus infarct, EF 25%, started on coreg, asa and plavix.  resultant dysarthria, RUE weakness, field cut, speech   Hx cardiomyopathy, EF 25% in the past  Other Active Problems  Chest pain   Renal insufficiency  Hospital day # 0  Ryan MoodyBIBY,[name]  [name] See Amion for Pager information 09/21/2016 8:26 AM  I have personally examined this patient, reviewed notes, independently viewed imaging studies, participated in medical decision making and plan of care.ROS completed by me personally and pertinent positives fully documented  I have made any additions or clarifications directly to the above note. Agree with checking cardiac MRI left ventricular clot is confirmed changing aspirin to anticoagulation with warfarin otherwise leaving the patient on aspirin. No need for Plavix from stroke standpoint unless there is a cardiac indication. Stroke team will sign off. Kindly call for questions.  Delia HeadyPramod Denaja Verhoeven, MD Medical Director [facility] Pager: [phone] 09/21/2016 12:30 PM  To contact Stroke Continuity provider, please refer to [email or web address]. After hours, contact General Neurology

## 2016-09-21 NOTE — Procedures (Signed)
Pt discharged at 2016. Discharge instruction including stroke education given to pt and patient verbalized understanding. VS stable, pt reported BM prior to discharge. Friend will provide transportation. Medical staff accompanied pt to the exit door.

## 2016-09-24 LAB — PROTHROMBIN GENE MUTATION

## 2016-09-24 LAB — FACTOR 5 LEIDEN

## 2016-10-05 ENCOUNTER — Telehealth: Payer: Self-pay | Admitting: Cardiovascular Disease

## 2016-10-05 NOTE — Telephone Encounter (Signed)
Closed encounter °

## 2016-10-08 ENCOUNTER — Ambulatory Visit: Payer: 59 | Admitting: Cardiovascular Disease

## 2018-04-23 IMAGING — MR MR HEAD W/O CM
10 of 11 series · 36 of 48 positions shown · non-contrast
Comparison: CTA, CT perfusion and CT head from today.

CLINICAL DATA: 25-year-old male code stroke presentation today with
right side symptoms. Subacute to chronic appearing posterior left
MCA ischemia on noncontrast CT, with largely negative CTA/CTP
findings. Initial encounter.

EXAM:
MRI HEAD WITHOUT CONTRAST
TECHNIQUE: Multiplanar, multiecho pulse sequences of the brain and surrounding
structures were obtained without intravenous contrast.

[Series 3: DWI · axial · 3.0mm · 0.94mm/px · z∈[-86,+68]mm · 8 of 105 slices shown (1 of 2)]
[im 1/105]
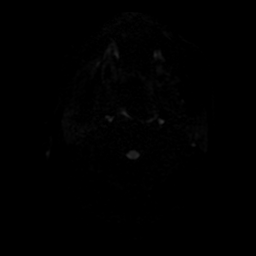
[im 15/105]
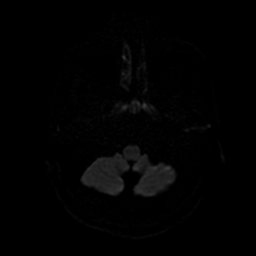
[im 30/105]
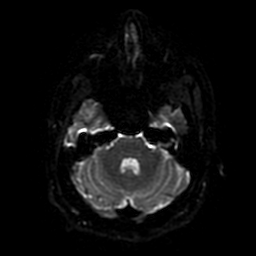
[im 45/105]
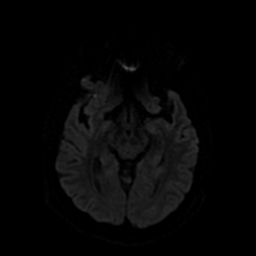
[im 60/105]
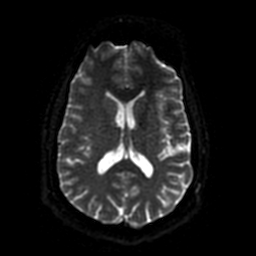
[im 75/105]
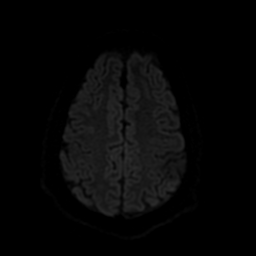
[im 90/105]
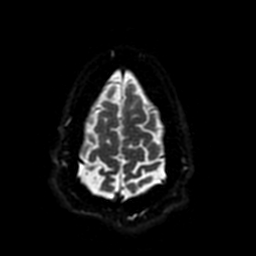
[im 105/105]
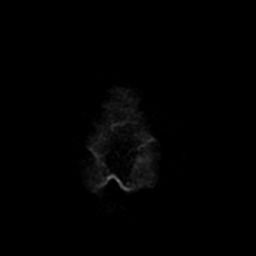

[Series 4: cor focus all · coronal · 3.0mm · 0.70mm/px · 3 of 32 slices shown]
[im 1/32]
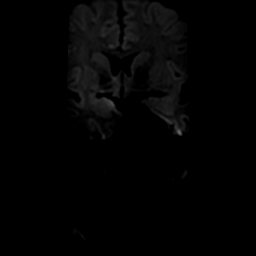
[im 16/32]
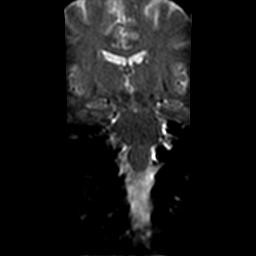
[im 32/32]
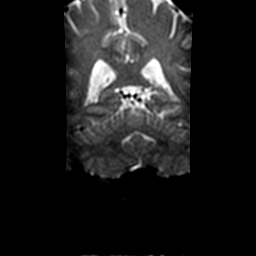

[Series 5: FLAIR · sagittal · 5.0mm · 0.47mm/px · 2 of 23 slices shown (1 of 2)]
[im 1/23]
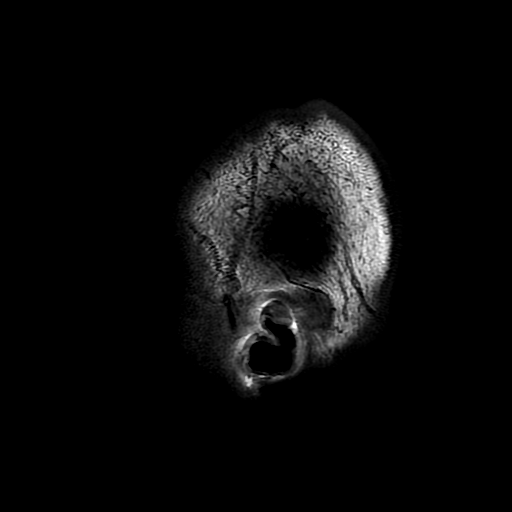
[im 23/23]
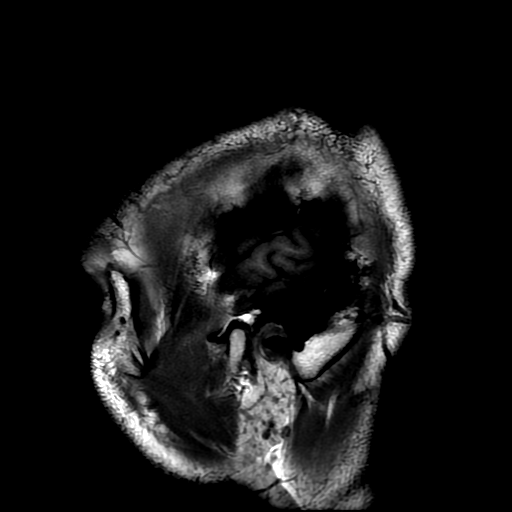

[Series 6: T2 · axial · 5.0mm · 0.47mm/px · z∈[-83,+72]mm · 2 of 27 slices shown]
[im 1/27]
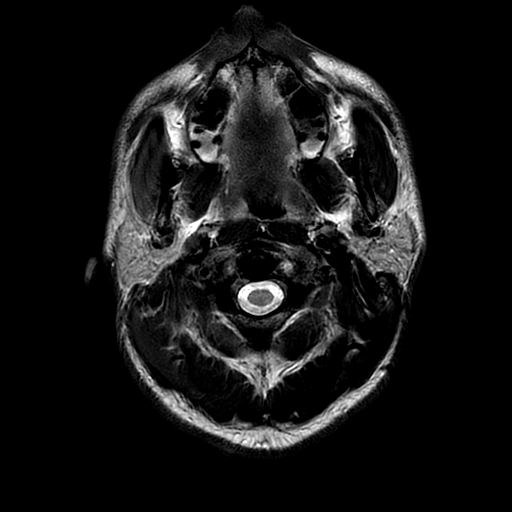
[im 27/27]
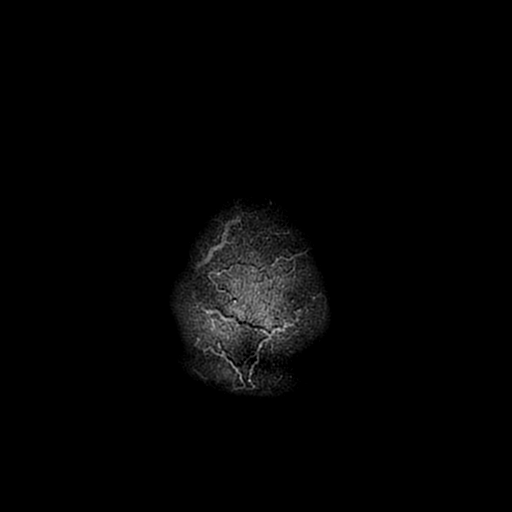

[Series 7: FLAIR · axial · 5.0mm · 0.47mm/px · z∈[-83,+72]mm · 2 of 27 slices shown (2 of 2)]
[im 1/27]
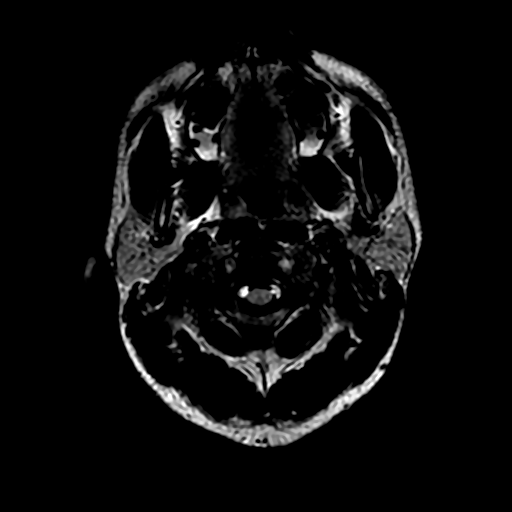
[im 27/27]
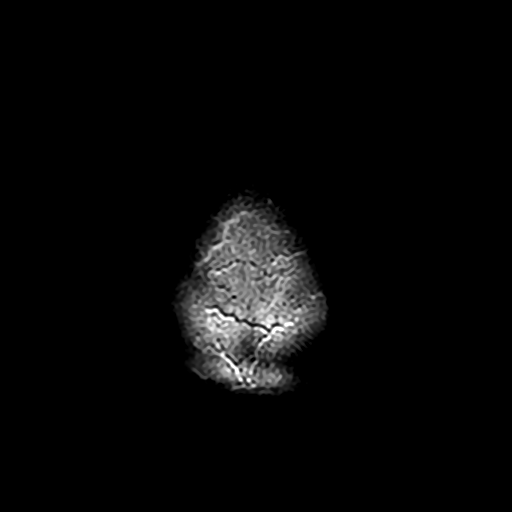

[Series 8: DWI · coronal · 4.0mm · 0.94mm/px · 6 of 72 slices shown (2 of 2)]
[im 1/72]
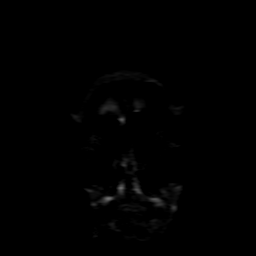
[im 15/72]
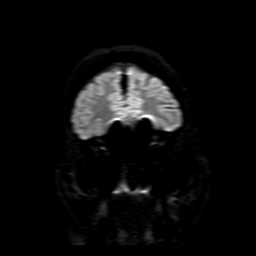
[im 29/72]
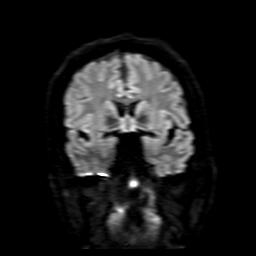
[im 43/72]
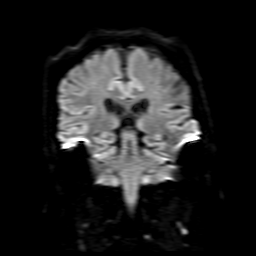
[im 57/72]
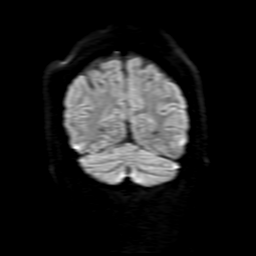
[im 72/72]
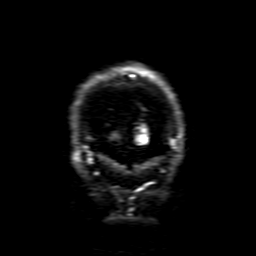

[Series 9: (person_name) · axial · 3.0mm · 0.47mm/px · z∈[-82,-64]mm · 2 of 100 slices shown]
[im 1/100]
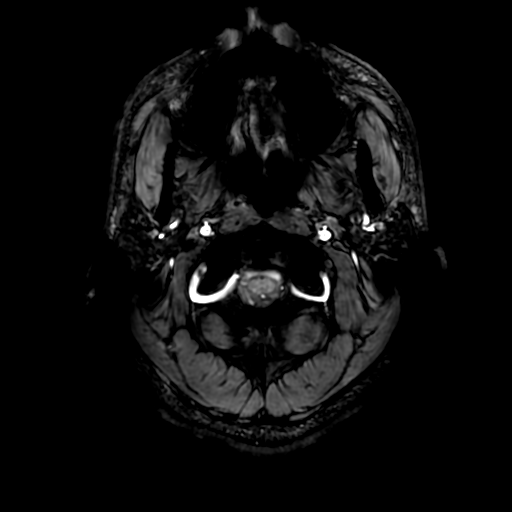
[im 13/100]
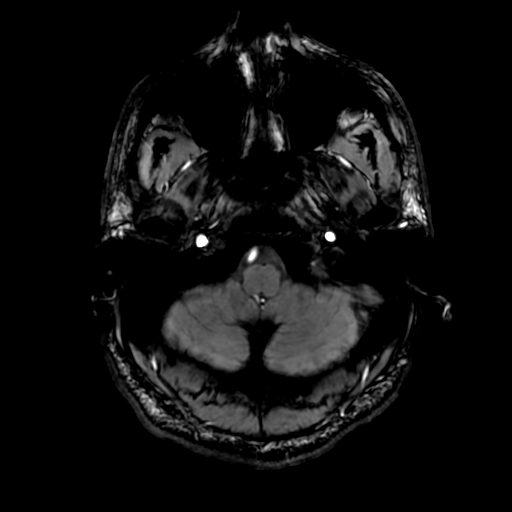

[Series 11: T2 post-contrast · coronal · 5.0mm · 0.39mm/px · 3 of 30 slices shown]
[im 1/30]
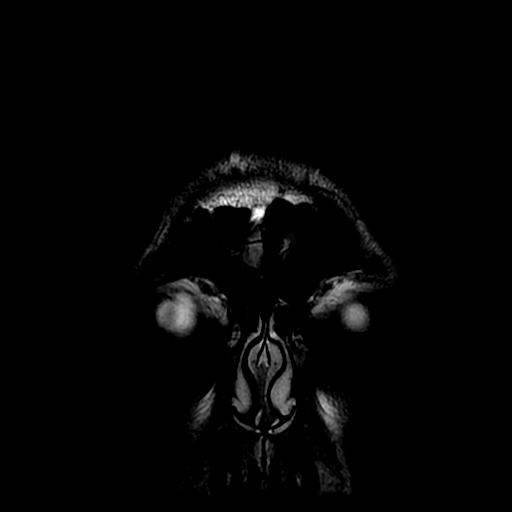
[im 15/30]
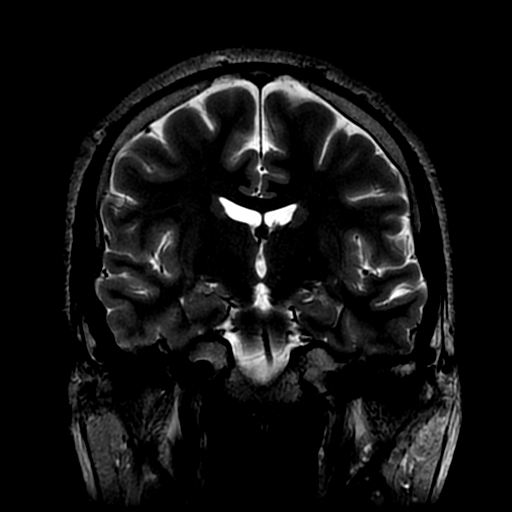
[im 30/30]
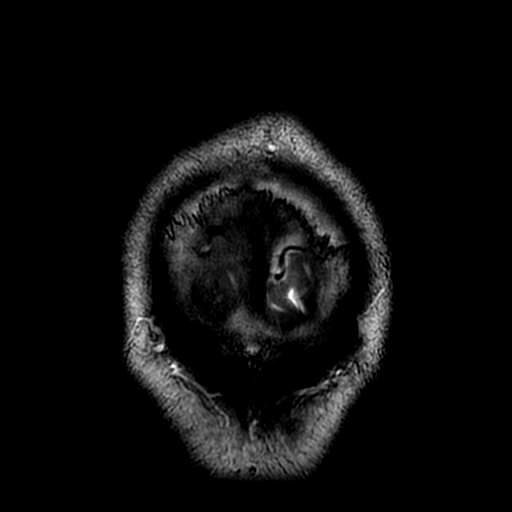

[Series 350: ADC · axial · 3.0mm · 0.94mm/px · z∈[-86,+68]mm · 5 of 53 slices shown (1 of 2)]
[im 1/53]
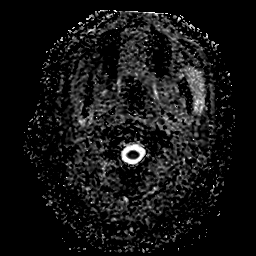
[im 14/53]
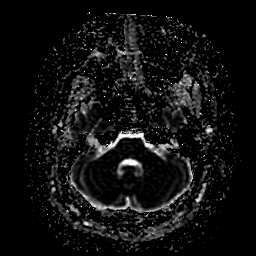
[im 27/53]
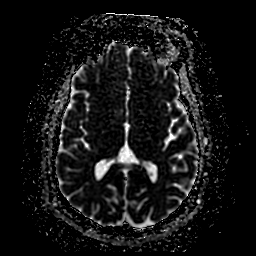
[im 40/53]
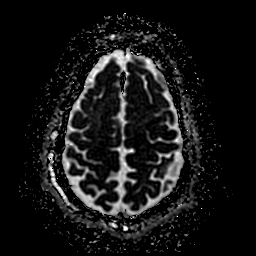
[im 53/53]
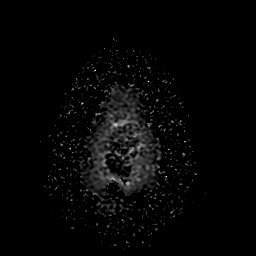

[Series 850: ADC · coronal · 4.0mm · 0.94mm/px · 3 of 36 slices shown (2 of 2)]
[im 1/36]
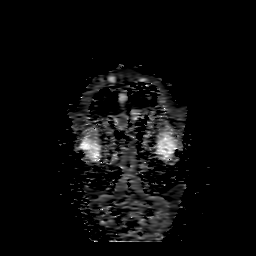
[im 18/36]
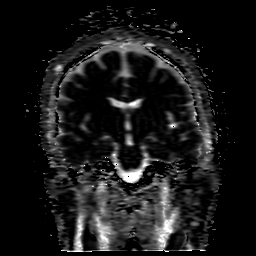
[im 36/36]
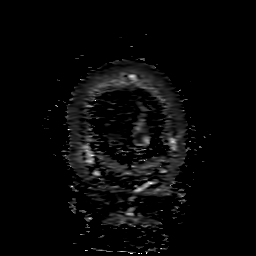

[36 of 48 positions shown; findings below may reference images not displayed]

FINDINGS: Brain: No restricted diffusion or evidence of acute infarction. Mild
encephalomalacia with Facilitated cortical diffusion along the
posterior left sylvian fissure corresponding to the cortical
abnormality on CT today.

Elsewhere normal gray and white matter signal. No chronic cerebral
blood products identified. No midline shift, mass effect, evidence
of mass lesion, ventriculomegaly, extra-axial collection or acute
intracranial hemorrhage. Cervicomedullary junction and pituitary are
within normal limits.

Vascular: Major intracranial vascular flow voids are preserved.

Skull and upper cervical spine: Negative. Normal bone marrow signal.

Sinuses/Orbits: Negative orbits. Visualized paranasal sinuses and
mastoids are stable and well pneumatized.

Other: Negative scalp soft tissues. Visible internal auditory
structures appear normal.
IMPRESSION: 1. Negative for acute infarct or acute intracranial abnormality.
2. Small area of chronic ischemia in the posterior left MCA
territory, corresponding to the CT findings today.
3. Preliminary report of the above discussed by telephone with Dr.
Adlaho on 09/18/2016 At 3071 hours.

## 2020-11-29 ENCOUNTER — Encounter (HOSPITAL_COMMUNITY): Payer: Self-pay | Admitting: Emergency Medicine

## 2020-11-29 ENCOUNTER — Ambulatory Visit (HOSPITAL_COMMUNITY)
Admission: EM | Admit: 2020-11-29 | Discharge: 2020-11-29 | Disposition: A | Discharge status: Home / Self Care | Payer: BC Managed Care – PPO | Attending: Family Medicine | Admitting: Family Medicine

## 2020-11-29 ENCOUNTER — Other Ambulatory Visit: Payer: Self-pay

## 2020-11-29 DIAGNOSIS — R300 Dysuria: Secondary | ICD-10-CM | POA: Insufficient documentation

## 2020-11-29 DIAGNOSIS — R369 Urethral discharge, unspecified: Secondary | ICD-10-CM | POA: Insufficient documentation

## 2020-11-29 DIAGNOSIS — R8290 Unspecified abnormal findings in urine: Secondary | ICD-10-CM | POA: Insufficient documentation

## 2020-11-29 LAB — POCT URINALYSIS DIPSTICK, ED / UC
Bilirubin Urine: NEGATIVE
Glucose, UA: NEGATIVE mg/dL
Hgb urine dipstick: NEGATIVE
Ketones, ur: NEGATIVE mg/dL
Leukocytes,Ua: NEGATIVE
Nitrite: NEGATIVE
Protein, ur: NEGATIVE mg/dL
Specific Gravity, Urine: >=1.03 (ref 1.005–1.030)
Urobilinogen, UA: 0.2 mg/dL (ref 0.0–1.0)
pH: 6 (ref 5.0–8.0)

## 2020-11-29 NOTE — Discharge Instructions (Addendum)
We have sent testing for sexually transmitted infections. We will notify you of any positive results once they are received. If required, we will prescribe any medications you might need.  Please refrain from all sexual activity for at least the next seven days.  Your urine sample was completely normal.

## 2020-11-29 NOTE — ED Triage Notes (Signed)
Pt states that he has back pain, odor in urine, frequent urination, and testicle pain. Pt states that his sx started a couple weeks ago.

## 2020-11-30 LAB — CYTOLOGY, (ORAL, ANAL, URETHRAL) ANCILLARY ONLY
Chlamydia: NEGATIVE
Comment: NEGATIVE
Comment: NEGATIVE
Comment: NORMAL
Neisseria Gonorrhea: NEGATIVE
Trichomonas: NEGATIVE

## 2020-11-30 NOTE — ED Provider Notes (Signed)
[facility]   [id number] 11/29/20 Arrival Time: 1428  ASSESSMENT & PLAN:  1. Dysuria   2. Abnormal urine odor   3. Penile discharge     U/A normal.   Discharge Instructions     We have sent testing for sexually transmitted infections. We will notify you of any positive results once they are received. If required, we will prescribe any medications you might need.  Please refrain from all sexual activity for at least the next seven days.  Your urine sample was completely normal.    Pending: Labs Reviewed  POCT URINALYSIS DIPSTICK, ED / UC  CYTOLOGY, (ORAL, ANAL, URETHRAL) ANCILLARY ONLY   No empiric tx given. Will notify of any positive results. Instructed to refrain from sexual activity for at least seven days.  Reviewed expectations re: course of current medical issues. Questions answered. Outlined signs and symptoms indicating need for more acute intervention. Patient verbalized understanding. After Visit Summary given.   SUBJECTIVE:  Ryan Howard is a 29 y.o. male who presents with complaint of penile discharge and urine odor; 1-2 weeks. Describes discharge as thick and white. No specific aggravating or alleviating factors reported. Denies: dysuria and gross hematuria. Questions mild urinary frequency. Afebrile. No abdominal or pelvic pain. No n/v. No rashes or lesions. Reports that he is sexually active with single male partner. OTC treatment: none. History of STI: none reported.   OBJECTIVE:  Vitals:   11/29/20 1736  BP: (!) 146/93  Pulse: 79  Resp: 18  Temp: 97.9 F (36.6 C)  TempSrc: Oral  SpO2: 99%     General appearance: alert, cooperative, appears stated age and no distress Throat: lips, mucosa, and tongue normal; teeth and gums normal Lungs: unlabored respirations; speaks full sentences without difficulty Back: no CVA tenderness; FROM at waist Abdomen: soft, non-tender GU: deferred Skin: warm and dry Psychological: alert and  cooperative; normal mood and affect.  Results for orders placed or performed during the hospital encounter of 11/29/20  POC Urinalysis dipstick  Result Value Ref Range   Glucose, UA NEGATIVE NEGATIVE mg/dL   Bilirubin Urine NEGATIVE NEGATIVE   Ketones, ur NEGATIVE NEGATIVE mg/dL   Specific Gravity, Urine >=1.030 1.005 - 1.030   Hgb urine dipstick NEGATIVE NEGATIVE   pH 6.0 5.0 - 8.0   Protein, ur NEGATIVE NEGATIVE mg/dL   Urobilinogen, UA 0.2 0.0 - 1.0 mg/dL   Nitrite NEGATIVE NEGATIVE   Leukocytes,Ua NEGATIVE NEGATIVE    Labs Reviewed  POCT URINALYSIS DIPSTICK, ED / UC  CYTOLOGY, (ORAL, ANAL, URETHRAL) ANCILLARY ONLY    No Known Allergies  Past Medical History:  Diagnosis Date  . CVA (cerebral vascular accident) (HCC)   . Depression   . Obesity   . 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 Stroke [facility])    Family History  Problem Relation Age of Onset  . Stroke Paternal Grandfather   . Diabetes Other   . Heart disease Other   . Stroke Other    Social History   Socioeconomic History  . Marital status: Single    Spouse name: Not on file  . Number of children: Not on file  . Years of education: Not on file  . Highest education level: Not on file  Occupational History  . Not on file  Tobacco Use  . Smoking status: Never Smoker  . Smokeless tobacco: Never Used  Substance and Sexual Activity  . Alcohol use: No  . Drug use:  No  . Sexual activity: Not on file  Other Topics Concern  . Not on file  Social History Narrative  . Not on file   Social Determinants of Health   Financial Resource Strain: Not on file  Food Insecurity: Not on file  Transportation Needs: Not on file  Physical Activity: Not on file  Stress: Not on file  Social Connections: Not on file  Intimate Partner Violence: Not on file          Mardella Layman, MD 11/30/20 [phone]
